# Patient Record
Sex: Female | Born: 1990 | Race: Black or African American | Hispanic: No | Marital: Single | Smoking: Current every day smoker
Health system: Southern US, Community
[De-identification: ages and names within clinical notes are randomized; demographics above are authoritative.]

## PROBLEM LIST (undated history)

## (undated) DIAGNOSIS — R569 Unspecified convulsions: Secondary | ICD-10-CM

## (undated) DIAGNOSIS — S82851A Displaced trimalleolar fracture of right lower leg, initial encounter for closed fracture: Secondary | ICD-10-CM

## (undated) DIAGNOSIS — Z8619 Personal history of other infectious and parasitic diseases: Secondary | ICD-10-CM

## (undated) DIAGNOSIS — Z87898 Personal history of other specified conditions: Secondary | ICD-10-CM

## (undated) HISTORY — DX: Personal history of other infectious and parasitic diseases: Z86.19

## (undated) HISTORY — PX: WISDOM TOOTH EXTRACTION: SHX21

---
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
## 2010-06-03 ENCOUNTER — Emergency Department (HOSPITAL_COMMUNITY): Admission: EM | Admit: 2010-06-03 | Discharge: 2010-06-03 | Payer: Self-pay | Admitting: Family Medicine

## 2010-06-05 ENCOUNTER — Emergency Department (HOSPITAL_COMMUNITY): Admission: EM | Admit: 2010-06-05 | Discharge: 2010-06-05 | Payer: Self-pay | Admitting: Family Medicine

## 2010-12-12 LAB — POCT RAPID STREP A (OFFICE): Streptococcus, Group A Screen (Direct): NEGATIVE

## 2010-12-12 LAB — STREP A DNA PROBE: Group A Strep Probe: NEGATIVE

## 2011-02-14 NOTE — Procedures (Signed)
CLINICAL HISTORY:  The patient is a 20 year old with previous EEG with three  episodes described as feeling nauseated followed by loss of consciousness  and shaking. This study was carried out with the patient sleep deprived. She  was awake and asleep during the recording. She takes no medication. The  International 10/20 system lead placement was used.   DESCRIPTION OF FINDINGS:  Dominant frequency is 11 hertz, 40 to 60 microvolt  activity, that is well regulated and attenuates partially with eye opening.  Background activity is a mixture of predominantly low voltage alpha/upper  theta range activity and beta range components.   Intermittent photo stimulation induced a driving response only around the  dominant frequency. The patient remained awake and then gradually drifted in  natural sleep and to drowsiness manifested by rhythmic theta activity  punctuated rare periods of arousal. Child drifted into natural sleep with  vertex sharp waves and symmetric and synchronous sleep spindles. The patient  was aroused toward the end and returned to waking record. There was no focal  slowing. There was no intraictal epileptiform activity in the form of spikes  or sharp waves. EKG showed regular sinus rhythm with ventricular response of  72 beats per minute.   IMPRESSION:  Normal record in the waking state and natural sleep after sleep  deprivation.    WILLIAM H. Sharene Skeans, M.D.   [id number]  D:  01/30/2004 06:59:45  T:  01/30/2004 08:07:48  Job #:  [id number]

## 2011-02-14 NOTE — Discharge Summary (Signed)
Sydney [name], Sydney [name]           ACCOUNT NO.:  [id number]   MEDICAL RECORD NO.:  [id number]          PATIENT TYPE:  INP   LOCATION:  0104                          FACILITY:  [facility]   PHYSICIAN:  Lalla Brothers, MDDATE OF BIRTH:  1990-11-29   DATE OF ADMISSION:  11/19/2004  DATE OF DISCHARGE:  11/26/2004                                 DISCHARGE SUMMARY   IDENTIFICATION:  This [age]-year-old female, eighth grade student at  [employer], was admitted emergently voluntarily on referral from  Peter Garter at [facility], who has seen the patient  since August of 2005 in therapy. She documented command auditory  hallucinations to harm or kill herself in the patient's emergency assessment  the day of admission and the need for hospitalization. For full details,  please see the typed admission assessment.   SYNOPSIS OF PRESENT ILLNESS:  The patient formulates that she started out  working on OCD symptoms in therapy and then switched to panic attacks. Along  the way, she was evaluated at [facility] for pseudoseizures and now she has  psychosis. The patient does not seem to solve the mechanism for her symptoms  but seems to displace her attention and conflict to another mechanism of  ineffectual dissipation. The patient reviews with little insight her video  EEG mechanisms at [facility] determining that she was having anxiety  rather than epilepsy. She did have a head scan and neurological assessment.  She is admitted reporting self-injury by making her fingertips bleed with  her fingernails, pens and pencils. She reported command auditory  hallucinations to jump from a bridge or from a moving car. She reported  visual hallucinations of seeing balls of light over the heads of people and  a person dressed in black standing in a room that others could not see. She  reported thermal misperceptions and apparently kinesthetic misperceptions in  a premonitory  fashion. She indicates she is very busy playing the flute and  piano in band and trying hard in academics. She is registering to be in a  [employer] [address] but recently quit dance because she was  overloaded. She states that sports keep her 85 year old brother busy. She  states father no longer lives with mother because he slacked off in his job.  Mother seems reasonably unavailable emotionally, usually smiling but having  little other interaction. The patient herself wants to be a child advocate  in the future.   INITIAL MENTAL STATUS EXAM:  The patient was neurotically organized and  conflicted but would not open up and talk particularly about family  structure confusion and diffusion. Father is most available to talk over  these concerns but is dismissed from the household while mother is present.  The patient had somatoform and conversion features in fact complaining of  sore throat the morning after admission with no medical findings evident.  She had borderline and hysteroid features to her own identity diffusion and  confusion. She was not disorganized and did not manifest pervasive  dissociation. However, the differential diagnosis must include unspecified  psychotic disorder as well as dissociative disorder,  unspecified type. She  reported suicidal ideation with irresistible command auditory hallucinations  to harm herself.   LABORATORY FINDINGS:  Admission CBC was normal except MCHC elevated at 34.3  with upper limit of normal 34, of doubtful significance. Monocyte  differential was 11% with upper limit of normal 9. White count was normal at  10,700, hemoglobin 14.1, MCV of 86 and platelet count 335,000. Comprehensive  metabolic panel was normal except BUN low at 5 with lower limit of normal 6  and indirect bilirubin elevated at 1.5 with upper limit of normal 1.  Alkaline phosphatase was borderline elevated at 118 for adult norms of 39-  117 so that she fit more  in adolescent norms. Sodium was normal at 139,  potassium 3.8, fasting glucose 89, creatinine 0.7, calcium 9.2, albumin 3.6,  AST 18 and ALT 13. GGT was normal at 19. Free T4 was normal at 1.01 and TSH  at 2.785. Urine hCG was negative. Urine drug screen was negative with  creatinine of 161 mg/dl. Urinalysis was normal with specific gravity of  1.018. RPR was nonreactive. Urine probe for gonorrhea and chlamydia  trichomatous by DNA amplification were both negative.   HOSPITAL COURSE AND TREATMENT:  General medical exam, by Vic Ripper,  P.A.-C., noted no medication allergies. The patient reported she had had  voices intermittently over the last month preceding admission. The patient  formulated that school, friends at home are good. She is histrionic and  superficial in this regard. She had menarche at age 61 and menses are  regular and she denies sexual activity. She had a benign birthmark on the  proximal medial left thigh. She reported that anxiety attacks have not only  neurologic but also respiratory symptoms at times. She was Tanner stage V  and denied any previous GYN exam. Admission height was 63-1/2 inches and  weight 148 pounds and discharge weight was 148 pounds. Blood pressure was  124/73 with heart rate of 94 (sitting) on admission and 112/74 with heart  rate of 106 (standing). Vital signs were normal throughout hospital stay  and, at the time of discharge, supine blood pressure was 117/58 with heart  rate of 89 and standing blood pressure 114/72 with heart rate of 98. The  patient participated in active inpatient treatment also in a superficial and  denial fashion initially. She and mother seemed to maintain the patient's  maladaptive style and pattern of interpersonal relatedness and problem-  solving that contribute to neurotic symptom formation. After initial family  intervention and two days of participation in all aspects of inpatient therapies, the patient was  started on Neurontin despite herself maintaining  that she was ready for discharge and needed no medication. She did comply  with Neurontin titrated up to 200 mg every morning and bedtime. The  medication was tolerated well and she had no side effects. She did have  dysmenorrhea during her hospital stay, treated with ibuprofen. The patient  gradually became more constructive in the treatment program, though she  still avoided family issues. She could address other issues. She was not  alienating to peers nor was she ostracized because of psychotic features.  Over the course of her hospitalization, it was clear that she had more  dissociative than psychotic features. The final family therapy session  included mother and maternal grandmother with father only participating  individually by phone. Mother and maternal grandmother projected blame to  paternal grandmother for all the patient's problems. They feel that paternal  grandmother tries to force the patient to be the perfect child as though  paternal grandmother is taking over as the mother figure. Mother did not  speak much in the session and maternal grandmother did most of the talking  but mother agreed with the maternal grandmother. In the final family therapy  session, the patient suggested that no one and nothing helps her much and  projected the decision for hospitalization to her therapist. The patient  acknowledged that paternal grandmother gets in all of her business but that  she does not let it bother her. Mother answered for the patient that father  should spend more time with the patient while the patient indicated that she  sees father enough and that he is trying to do better in his business and  should be acknowledged. The patient concluded that her misperceptions were  zoning out in mechanism. She indicated that her concentration was improved  and anxiety contained and she was ready for discharge. She required no   seclusion, restraint or equivalent of such during the hospital stay and did  participate in all aspects of active treatment program including group,  milieu, behavioral, individual, special education, anger management,  occupational and therapeutic recreational therapies.   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  2.  Anxiety disorder not otherwise specified.  3.  Conversion disorder.  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  5.  Rule out psychotic disorder not otherwise specified (provisional      diagnosis).  6.  Parent-child problem.  7.  Other specified family circumstances.   AXIS II:  Diagnosis deferred.   AXIS III:  1.  Myopia.  2.  Pseudoseizures.  3.  Mildly overweight.  4.  Elevated indirect bilirubin of doubtful significance.   AXIS IV:  Stressors:  Family--severe, acute and chronic; phase of life--  severe, acute and chronic; peer relations--moderate, acute and chronic.  AXIS V:  Global Assessment of Functioning on admission 41; highest in last  year 75; discharge Global Assessment of Functioning 56.   CONDITION ON DISCHARGE:  Though patient is very capable of participating in  therapy, she remains relatively insightless and relatively resistant  particularly relative to object relations issues with family and the various  condensations by which the family avoids resolving these problems. The  patient seems to be most perplexed about father being evicted from the  family because of his slacking off business. The patient seems hesitant to  achieve herself in that way but does continue including becoming aware of  her formal registration in the [employer] pageant during the  course of her hospitalization.   DISCHARGE MEDICATIONS:  She is discharged on Neurontin 100 mg capsule, to  take 2 capsules every morning and bedtime; quantity #120 with no refill. She  and mother were educated on  medication as was father.   The patient understands and family as well that she does not have epilepsy.  The patient does not manifest bipolar disorder.   ACTIVITY/DIET:  The patient follows a weight control diet and is encouraged  to be physically active.   FOLLOW UP:  Crisis and safety plans are outlined if needed. She will see the  Peter Garter on November 27, 2004 at 12 noon for therapy. She will see Christain Sacramento for medication management at [facility] on November 28, 2004 at 08:30.      GEJ/MEDQ  D:  11/27/2004  T:  11/27/2004  Job:  [id number]   cc:   Peter Garter  [facility]  [address]  [place], [place] [address]   Christain Sacramento  [facility]  [address]  [place], [place]

## 2011-02-14 NOTE — H&P (Signed)
NAMEJESSYE, [name]           ACCOUNT NO.:  [id number]   MEDICAL RECORD NO.:  [id number]          PATIENT TYPE:  INP   LOCATION:  0104                          FACILITY:  [facility]   PHYSICIAN:  Sydney [name], MDDATE OF BIRTH:  1990-12-23   DATE OF ADMISSION:  11/19/2004  DATE OF DISCHARGE:                         PSYCHIATRIC ADMISSION ASSESSMENT   IDENTIFICATION:  A 20-year-old female, 8th grade student at [employer], is admitted emergently, voluntarily on referral from Sydney [name] at  [employer], for inpatient stabilization of command  auditory hallucinations to harm or kill herself.   HISTORY OF PRESENT ILLNESS:  The patient has been in therapy with Sydney [name] since August of 2005.  She states initially she was thought to have  obsessive-compulsive disorder and then panic symptoms.  She is now  presenting with psychosis symptoms.  The patient indicates that no one has  been able to solve her problems, though at times they can clarify the  problems are not going to harm anything and may just be due to stress.  However the patient's distress is becoming more consequential over time.  She describes that she has had seizures spells that have been concluded to  be due to stress or anxiety.  She will not open up about the details for  these but notes she was ultimately evaluated at [facility] with video  EEG and did have a CT or MR scan.  She states all of her tests were normal  and the spells were determined to be stress or anxiety.  However the patient  has not capitalized upon such findings to resolve problems.  In fact she  seemed to have likely one of the pseudo seizures immediately being sent for  hospitalization.  The teacher was concerned that the patient had torn up  paper at school when looking somewhat dazed and distant, as though she was  somewhat disorganized.  The patient stated at the time of admission that she  does not  want to harm herself but she has been stabbing pens and pencils  into her finger tips as well as biting them until they bleed.  The patient  states she does these things to her fingernails and she is not sure how the  skin splits open.  The patient acknowledges that the voice made her tear up  papers in school the day of admission and the voices then telling her to  jump from a bridge.  The voice also more recently told her to jump from a  moving car.  She suggests that she does not want to do these things to harm  herself but cannot control herself when the voice becomes repeated and  constant.  She has also reported thermal sensations of misperceptions as  though something is hot or cold  near her.  She reports visual  hallucinations of balls of light over peoples' heads and a person dressed in  black standing in a room.  She reports that she has been considered to have  OCD and then panic.  She has also been concluded to have pseudo seizures.  The patient is using no alcohol or illicit drugs.  She states she is driven  in school and likely takes on too much.  She has been wanting to be in a  pageant and has recently quit dance.  She wants to be a child advocate.  She  plays the piano and flute in the band and is trying hard in her academics.   PAST MEDICAL HISTORY:  The patient has no known organic central nervous  system trauma.  The patient has myopia, treated with eyeglasses.  She had  chicken pox in the past.  She has a benign birthmark on the mid back.  Her  last menses was October 24, 2004 and she denies sexual activity.  She had  the neurological workup at [facility] for pseudo seizures, including a head  scan and a video EEG>  She is complaining of sore throat at the hospital.  She has no medication allergies.  She is on no current medications.  She has  had no heart murmur or arrythmia.  She has no other syncope.   REVIEW OF SYSTEMS:  The patient denies any difficulty with gait,  gaze or  continence.  She denies exposure to communicable disease or toxins.  She  denies rash, jaundice or purpura.  There is no chest pain, palpitations, or  presyncope.  There is no abdominal pain, nausea, vomiting or diarrhea.  There is no dysuria or arthralgia.  Immunizations are up to date.   FAMILY HISTORY:  The patient lives with mother and 48 year old brother.  She  states that her brother is into sports and that keeps him busy.  She tries  to stay busy but is not feeling the same sense of satisfaction or  fulfillment.  She states that parents never married.  She states that father  slacked off in his job and mother than separated.  The patient lives with  mother but father was here to visit the morning after admission.  They do  not clarify other major psychiatric disorders in the family history.   SOCIAL AND DEVELOPMENTAL HISTORY:  The patient is in the 8th grade at  [employer].  She wants to be a child advocate in the future.  She  plays the piano and flute in the band.  She has recently quit dance because  she was overloaded, but she still has more to take on including wanting to  do a pageant now.   ASSETS:  The patient is intelligent and driven.   MENTAL STATUS EXAM:  Height is 63.5 inches and weight is 148 pound with  blood pressure 124/73 with heart rate of 94 sitting and 112/74 with heart  rate of 106 standing.  The patient is right hand.  She is alert and oriented  with speech intact.  Cranial nerves II-XII intact.  Deep tendon reflexes and  AMRs are 0/0.  Muscle strength and tone are normal.  There are no  pathological reflexes or soft neurologic findings.  There are no abnormal  involuntary movements.  Gait and gaze are intact.  The patient is  neurotically organized and conflicted but she does not open up and talk  directly about such.  She has somatoform symptoms particularly in conversion pseudo seizures.  She has a sore throat this morning in same  regard.  She  has identity diffusion and confusion with borderline and hysteroid features.  She has no frank disorganization or paranoia.  She reports auditory, visual,  thermal, and kinesthetic  hallucinations.  She does not manifest frank  dissociation.  She reports command auditory hallucinations to harm or kill  herself.  She has suicide ideation and reports irrestible command auditory  hallucinations to harm herself but not others.   IMPRESSION:  AXIS 1:  1.  Psychotic disorder not otherwise specified.  2.  Anxiety disorder not otherwise specified.  3.  Conversion disorder.  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  5.  Parent-child problem.  6.  Other specified family circumstances.  7.  Other interpersonal problem.  AXIS II:  Diagnosis deferred.  AXIS III:  1.  Myopia.  2.  Pseudo seizures.  3.  Sore throat with benign exam.  AXIS IV:  Stressors:  Family - moderate to severe, acute and chronic; phase of life -  severe, acute and chronic; peer relations - moderate, acute and chronic.  AXIS V:  Global assessment of function at time of admission 41 with highest in last  year 75.   PLAN:  The patient is admitted for inpatient adolescent psychiatric and  multi-disciplinary, multi-modal behavioral health treatment in a team-based  program in a locked psychiatric unit.  We will consider Seroquel  pharmacotherapy.  Cognitive behavioral,  desensitization, rehabilitative  restoration of function, habit reversal, object relations, identity  consolidation, individuation and separation, anger management and  communication skill therapies are planned.  Estimated length of stay is 5-7  days with target symptoms for discharge being stabilization of suicide risk  and mood,  stabilization of misperceptions and mounting consequences of somatoform  decompensation, reestablishment of effective communication and relational  containment, and generalization of the capacity  for safe and effective  participation in outpatient treatment.      GEJ/MEDQ  D:  11/20/2004  T:  11/20/2004  Job:  [id number]

## 2011-02-14 NOTE — Procedures (Signed)
CLINICAL HISTORY:  The patient is a 20 year old who had two episodes of  seizure-like activity shaking within 1 week apart.  This was not further  described by the technologist.   PROCEDURE:  The tracing was carried out on a 32-channel digital Cadwell  recorder reformatted into 16-channel montages with one devoted to EKG.  The  patient was awake and drowsy during the recording.  The International 10-20  System lead placement was used.  Patient takes no medication.   DESCRIPTION OF FINDINGS:  Dominant frequency is a 25-50 microvolt 10 Hz  activity that is well regulated and attenuates partially with eye opening.   When the patient is fully awake, a rare theta range activity is superimposed  upon this and frontally predominant under 20 microvolt beta range activity  superimposed in the frontal regions.   When the patient becomes drowsy, mixed frequency rhythmic and semi-  arrhythmic 4-5 Hz 30-40 microvolt activity was seen in the central regions.  This then becomes generalized as the patient enters drowsiness.  The patient  then enters light natural sleep with vertex sharp waves, symmetric and  synchronous sleep spindles, 2-3 Hz desynchronized delta and lower theta  range activity.  There was no focal slowing.  There was a single generalized  irregularly contoured delta range discharge with embedded spikes lasting  less than 1 second.  This was not sufficient to be epileptogenic from an  electrographic viewpoint.   Activating procedures with hyperventilation caused no significant change,  intermittent photic stimulation failed to induce a driving response.   EKG showed regular sinus rhythm with ventricular response of 60 beats per  minute.   IMPRESSION:  Essentially normal record in the waking state.  Clinically  important a repeat study should be carried with the patient sleep deprived.    WILLIAM H. Sharene Skeans, M.D.   ZOX:WRUE  D:  01/24/2004 07:14:51  T:  01/24/2004 08:06:07   Job #:  [id number]   cc:   Alma Downs, M.D.  [address]  [place]  [place] [address]  Fax: [phone]

## 2012-10-03 ENCOUNTER — Encounter (HOSPITAL_COMMUNITY): Payer: Self-pay | Admitting: Emergency Medicine

## 2012-10-03 ENCOUNTER — Emergency Department (INDEPENDENT_AMBULATORY_CARE_PROVIDER_SITE_OTHER)
Admission: EM | Admit: 2012-10-03 | Discharge: 2012-10-03 | Disposition: A | Discharge status: Nursing Home (Includes ICF) | Payer: BC Managed Care – PPO | Source: Home / Self Care

## 2012-10-03 ENCOUNTER — Emergency Department (HOSPITAL_COMMUNITY)
Admission: EM | Admit: 2012-10-03 | Discharge: 2012-10-03 | Disposition: A | Discharge status: Home / Self Care | Payer: BC Managed Care – PPO | Attending: Emergency Medicine | Admitting: Emergency Medicine

## 2012-10-03 ENCOUNTER — Emergency Department (HOSPITAL_COMMUNITY): Payer: BC Managed Care – PPO

## 2012-10-03 ENCOUNTER — Encounter (HOSPITAL_COMMUNITY): Payer: Self-pay | Admitting: *Deleted

## 2012-10-03 DIAGNOSIS — H5789 Other specified disorders of eye and adnexa: Secondary | ICD-10-CM | POA: Insufficient documentation

## 2012-10-03 DIAGNOSIS — S0010XA Contusion of unspecified eyelid and periocular area, initial encounter: Secondary | ICD-10-CM | POA: Insufficient documentation

## 2012-10-03 DIAGNOSIS — H05239 Hemorrhage of unspecified orbit: Secondary | ICD-10-CM

## 2012-10-03 MED ORDER — HYDROCODONE-ACETAMINOPHEN 5-325 MG PO TABS: 1.0000 | ORAL_TABLET | Freq: Four times a day (QID) | ORAL | Status: DC | PRN

## 2012-10-03 MED ORDER — IBUPROFEN 800 MG PO TABS
800.0000 mg | ORAL_TABLET | Freq: Once | ORAL | Status: AC
  Administered 2012-10-03: 800 mg via ORAL
  Filled 2012-10-03: qty 1

## 2012-10-03 MED ORDER — IBUPROFEN 800 MG PO TABS: 800.0000 mg | ORAL_TABLET | Freq: Three times a day (TID) | ORAL | Status: DC | PRN

## 2012-10-03 NOTE — ED Notes (Signed)
Pt given fresh ice pack

## 2012-10-03 NOTE — ED Provider Notes (Signed)
Medical screening examination/treatment/procedure(s) were performed by non-physician practitioner and as supervising physician I was immediately available for consultation/collaboration.  Raynald Blend, MD 10/03/12 1435
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
## 2012-10-03 NOTE — ED Provider Notes (Signed)
Medical screening examination/treatment/procedure(s) were performed by non-physician practitioner and as supervising physician I was immediately available for consultation/collaboration.   Dione Booze, MD 10/03/12 [phone]
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
## 2012-10-03 NOTE — ED Notes (Signed)
Report given to S. York, RN and M. Ward, CMA.
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
## 2012-10-03 NOTE — ED Notes (Signed)
[employer] notified after pt requested.
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
## 2014-11-15 LAB — PROCEDURE REPORT - SCANNED: PAP SMEAR: NEGATIVE

## 2015-01-25 ENCOUNTER — Emergency Department (HOSPITAL_COMMUNITY)
Admission: EM | Admit: 2015-01-25 | Discharge: 2015-01-25 | Disposition: A | Discharge status: Home / Self Care | Payer: 59 | Attending: Emergency Medicine | Admitting: Emergency Medicine

## 2015-01-25 ENCOUNTER — Encounter (HOSPITAL_COMMUNITY): Payer: Self-pay | Admitting: Emergency Medicine

## 2015-01-25 DIAGNOSIS — Z720 Tobacco use: Secondary | ICD-10-CM | POA: Insufficient documentation

## 2015-01-25 DIAGNOSIS — L0501 Pilonidal cyst with abscess: Secondary | ICD-10-CM | POA: Insufficient documentation

## 2015-01-25 DIAGNOSIS — K6289 Other specified diseases of anus and rectum: Secondary | ICD-10-CM | POA: Diagnosis present

## 2015-01-25 MED ORDER — LIDOCAINE-EPINEPHRINE (PF) 2 %-1:200000 IJ SOLN
10.0000 mL | Freq: Once | INTRAMUSCULAR | Status: DC
  Filled 2015-01-25: qty 10

## 2015-01-25 MED ORDER — HYDROCODONE-ACETAMINOPHEN 5-325 MG PO TABS: 1.0000 | ORAL_TABLET | ORAL | Status: DC | PRN

## 2015-01-25 MED ORDER — OXYCODONE-ACETAMINOPHEN 5-325 MG PO TABS
1.0000 | ORAL_TABLET | Freq: Once | ORAL | Status: AC
  Administered 2015-01-25: 1 via ORAL
  Filled 2015-01-25: qty 1

## 2015-01-25 MED ORDER — LIDOCAINE-EPINEPHRINE 2 %-1:100000 IJ SOLN
INTRAMUSCULAR | Status: AC
  Administered 2015-01-25: 1 mL
  Filled 2015-01-25: qty 1

## 2015-01-25 NOTE — Discharge Instructions (Signed)
°  Pilonidal Cyst, Care After °A pilonidal cyst occurs when hairs get trapped (ingrown) beneath the skin in the crease between the buttocks over your sacrum (the bone under that crease). Pilonidal cysts are most common in young men with a lot of body hair. When the cyst breaks(ruptured) or leaks, fluid from the cyst may cause burning and itching. If the cyst becomes infected, it causes a painful swelling filled with pus (abscess). The pus and trapped hairs need to be removed (often by lancing) so that the infection can heal. The word pilonidal means hair nest. °HOME CARE INSTRUCTIONS °If the pilonidal sinus was NOT DRAINING OR LANCED: °· Keep the area clean and dry. Bathe or shower daily. Wash the area well with a germ-killing soap. Hot tub baths may help prevent infection. Dry the area well with a towel. °· Avoid tight clothing in order to keep area as moisture-free as possible. °· Keep area between buttocks as free from hair as possible. A depilatory may be used. °· Take antibiotics as directed. °· Only take over-the-counter or prescription medicines for pain, discomfort, or fever as directed by your caregiver. °If the cyst WAS INFECTED AND NEEDED TO BE DRAINED: °· Your caregiver may have packed the wound with gauze to keep the wound open. This allows the wound to heal from the inside outward and continue to drain. °· Return as directed for a wound check. °· If you take tub baths or showers, repack the wound with gauze as directed following. Sponge baths are a good alternative. Sitz baths may be used three to four times a day or as directed. °· If an antibiotic was ordered to fight the infection, take as directed. °· Only take over-the-counter or prescription medicines for pain, discomfort, or fever as directed by your caregiver. °· If a drain was in place and removed, use sitz baths for 20 minutes 4 times per day. Clean the wound gently with mild unscented soap, pat dry, and then apply a dry dressing as  directed. °If you had surgery and IT WAS MARSUPIALIZED (LEFT OPEN): °· Your wound was packed with gauze to keep the wound open. This allows the wound to heal from the inside outwards and continue draining. The changing of the dressing regularly also helps keep the wound clean. °· Return as directed for a wound check. °· If you take tub baths or showers, repack the wound with gauze as directed following. Sponge baths are a good alternative. Sitz baths can also be used. This may be done three to four times a day or as directed. °· If an antibiotic was ordered to fight the infection, take as directed. °· Only take over-the-counter or prescription medicines for pain, discomfort, or fever as directed by your caregiver. °· If you had surgery and the wound was closed you may care for it as directed. This generally includes keeping it dry and clean and dressing it as directed. °SEEK MEDICAL CARE IF:  °· You have increased pain, swelling, redness, drainage, or bleeding from the area. °· You have a fever. °· You have muscles aches, dizziness, or a general ill feeling. °Document Released: 10/16/2006 Document Revised: 05/18/2013 Document Reviewed: 12/31/2006 °ExitCare® Patient Information ©2015 [employer]. This information is not intended to replace advice given to you by your health care provider. Make sure you discuss any questions you have with your health care provider. ° ° °

## 2015-01-25 NOTE — ED Notes (Signed)
Pt is c/o pain at her tailbone area  Pt states the pain started Tuesday and has progressively gotten worse  Pt states this happened once before but in a couple days it went away  Pt states she took ibuprofen earlier and the pain decreased

## 2015-01-25 NOTE — ED Provider Notes (Signed)
CSN: [id number]     Arrival date & time 01/25/15  0002 History   First MD Initiated Contact with Patient 01/25/15 0031     Chief Complaint  Patient presents with  . Cyst     (Consider location/radiation/quality/duration/timing/severity/associated sxs/prior Treatment) Patient is a 24 y.o. female presenting with abscess. The history is provided by the patient. No language interpreter was used.  Abscess Location:  Ano-genital Ano-genital abscess location:  Gluteal cleft Abscess quality: not draining   Red streaking: no   Duration:  2 days Progression:  Worsening Associated symptoms: no fever and no nausea   Associated symptoms comment:  Painful swollen area at gluteal cleft without drainage, fever, or redness. She has had an abscess in this area once before that she reports resolved spontaneously.    History reviewed. No pertinent past medical history. History reviewed. No pertinent past surgical history. Family History  Problem Relation Age of Onset  . Hypertension Other   . Hyperlipidemia Other    History  Substance Use Topics  . Smoking status: Current Every Day Smoker    Types: Cigarettes  . Smokeless tobacco: Not on file  . Alcohol Use: Yes   OB History    No data available     Review of Systems  Constitutional: Negative for fever.  Gastrointestinal: Negative.  Negative for nausea.  Musculoskeletal: Negative.  Negative for myalgias.  Skin:       See HPI.      Allergies  Review of patient's allergies indicates no known allergies.  Home Medications   Prior to Admission medications   Medication Sig Start Date End Date Taking? Authorizing Provider  HYDROcodone-acetaminophen (NORCO/VICODIN) 5-325 MG per tablet Take 1 tablet by mouth every 6 (six) hours as needed for pain. 10/03/12   Charlestine Night, PA-C  ibuprofen (ADVIL,MOTRIN) 800 MG tablet Take 1 tablet (800 mg total) by mouth every 8 (eight) hours as needed for pain. 10/03/12   Christopher Lawyer, PA-C    BP 148/92 mmHg  Pulse 102  Temp(Src) 98 F (36.7 C) (Oral)  Resp 16  SpO2 98%  LMP 01/12/2015 (Exact Date) Physical Exam  Constitutional: She is oriented to person, place, and time. She appears well-developed and well-nourished.  Neck: Normal range of motion.  Pulmonary/Chest: Effort normal.  Neurological: She is alert and oriented to person, place, and time.  Skin: Skin is warm and dry.  Indurated area bilateral gluteal cleft with area of fluctuance on right buttock. No drainage. Minimal redness.     ED Course  Procedures (including critical care time) Labs Review Labs Reviewed - No data to display  Imaging Review No results found.   EKG Interpretation None     INCISION AND DRAINAGE Performed by: Elpidio Anis A Consent: Verbal consent obtained. Risks and benefits: risks, benefits and alternatives were discussed Type: abscess  Body area: gluteal cleft  Anesthesia: local infiltration  Incision was made with a scalpel.  Local anesthetic: lidocaine 2% w/ epinephrine  Anesthetic total: 2 ml  Complexity: complex Blunt dissection to break up loculations  Drainage: purulent  Drainage amount: large, purulent, malodorous  Packing material: 1/4 in iodoform gauze  Patient tolerance: Patient tolerated the procedure well with no immediate complications.    MDM   Final diagnoses:  None    1. Pilonidal abscess   Uncomplicated pilonidal cyst with abscess adequately drained and packed with iodoform gauze. She will return in 2 days for recheck and packing removal.    Elpidio Anis, PA-C 01/25/15 0210  Loren Raceravid Yelverton, MD 01/25/15 [phone]

## 2015-01-27 ENCOUNTER — Emergency Department (HOSPITAL_COMMUNITY)
Admission: EM | Admit: 2015-01-27 | Discharge: 2015-01-27 | Disposition: A | Discharge status: Home / Self Care | Payer: 59 | Attending: Emergency Medicine | Admitting: Emergency Medicine

## 2015-01-27 ENCOUNTER — Encounter (HOSPITAL_COMMUNITY): Payer: Self-pay | Admitting: Emergency Medicine

## 2015-01-27 DIAGNOSIS — H9202 Otalgia, left ear: Secondary | ICD-10-CM | POA: Insufficient documentation

## 2015-01-27 DIAGNOSIS — L0591 Pilonidal cyst without abscess: Secondary | ICD-10-CM | POA: Diagnosis not present

## 2015-01-27 DIAGNOSIS — J029 Acute pharyngitis, unspecified: Secondary | ICD-10-CM | POA: Insufficient documentation

## 2015-01-27 DIAGNOSIS — Z720 Tobacco use: Secondary | ICD-10-CM | POA: Diagnosis not present

## 2015-01-27 DIAGNOSIS — Z4801 Encounter for change or removal of surgical wound dressing: Secondary | ICD-10-CM | POA: Diagnosis present

## 2015-01-27 LAB — RAPID STREP SCREEN (MED CTR MEBANE ONLY): Streptococcus, Group A Screen (Direct): NEGATIVE

## 2015-01-27 MED ORDER — CLINDAMYCIN HCL 300 MG PO CAPS
300.0000 mg | ORAL_CAPSULE | Freq: Once | ORAL | Status: AC
  Administered 2015-01-27: 300 mg via ORAL
  Filled 2015-01-27: qty 1

## 2015-01-27 MED ORDER — MAGIC MOUTHWASH
10.0000 mL | Freq: Once | ORAL | Status: AC
  Administered 2015-01-27: 10 mL via ORAL
  Filled 2015-01-27: qty 10

## 2015-01-27 MED ORDER — HYDROCODONE-ACETAMINOPHEN 5-325 MG PO TABS: 1.0000 | ORAL_TABLET | ORAL | Status: DC | PRN

## 2015-01-27 MED ORDER — PREDNISONE 20 MG PO TABS: ORAL_TABLET | ORAL | Status: DC

## 2015-01-27 MED ORDER — CLINDAMYCIN HCL 150 MG PO CAPS: 150.0000 mg | ORAL_CAPSULE | Freq: Four times a day (QID) | ORAL | Status: DC

## 2015-01-27 MED ORDER — DEXAMETHASONE SODIUM PHOSPHATE 10 MG/ML IJ SOLN
10.0000 mg | Freq: Once | INTRAMUSCULAR | Status: AC
  Administered 2015-01-27: 10 mg via INTRAMUSCULAR
  Filled 2015-01-27: qty 1

## 2015-01-27 NOTE — Discharge Instructions (Signed)
Please stay hydrated and take antibiotic as prescribed for your sore throat.  Follow up with ENT specialist early next week for recheck of your throat infection.  Continue to run warm water to abscess of your buttock.  Return to ER if you are having worsening throat swelling, trouble swallowing or difficulty breathing.  Your strep test is normal today.    Pharyngitis Pharyngitis is redness, pain, and swelling (inflammation) of your pharynx.  CAUSES  Pharyngitis is usually caused by infection. Most of the time, these infections are from viruses (viral) and are part of a cold. However, sometimes pharyngitis is caused by bacteria (bacterial). Pharyngitis can also be caused by allergies. Viral pharyngitis may be spread from person to person by coughing, sneezing, and personal items or utensils (cups, forks, spoons, toothbrushes). Bacterial pharyngitis may be spread from person to person by more intimate contact, such as kissing.  SIGNS AND SYMPTOMS  Symptoms of pharyngitis include:   Sore throat.   Tiredness (fatigue).   Low-grade fever.   Headache.  Joint pain and muscle aches.  Skin rashes.  Swollen lymph nodes.  Plaque-like film on throat or tonsils (often seen with bacterial pharyngitis). DIAGNOSIS  Your health care provider will ask you questions about your illness and your symptoms. Your medical history, along with a physical exam, is often all that is needed to diagnose pharyngitis. Sometimes, a rapid strep test is done. Other lab tests may also be done, depending on the suspected cause.  TREATMENT  Viral pharyngitis will usually get better in 3-4 days without the use of medicine. Bacterial pharyngitis is treated with medicines that kill germs (antibiotics).  HOME CARE INSTRUCTIONS   Drink enough water and fluids to keep your urine clear or pale yellow.   Only take over-the-counter or prescription medicines as directed by your health care provider:   If you are prescribed  antibiotics, make sure you finish them even if you start to feel better.   Do not take aspirin.   Get lots of rest.   Gargle with 8 oz of salt water ( tsp of salt per 1 qt of water) as often as every 1-2 hours to soothe your throat.   Throat lozenges (if you are not at risk for choking) or sprays may be used to soothe your throat. SEEK MEDICAL CARE IF:   You have large, tender lumps in your neck.  You have a rash.  You cough up green, yellow-brown, or bloody spit. SEEK IMMEDIATE MEDICAL CARE IF:   Your neck becomes stiff.  You drool or are unable to swallow liquids.  You vomit or are unable to keep medicines or liquids down.  You have severe pain that does not go away with the use of recommended medicines.  You have trouble breathing (not caused by a stuffy nose). MAKE SURE YOU:   Understand these instructions.  Will watch your condition.  Will get help right away if you are not doing well or get worse. Document Released: 09/15/2005 Document Revised: 07/06/2013 Document Reviewed: 05/23/2013 [facility] IncExitCare Patient Information 2015 [place], [place]. This information is not intended to replace advice given to you by your health care provider. Make sure you discuss any questions you have with your health care provider.

## 2015-01-27 NOTE — ED Notes (Signed)
Per pt, here for abscess follow-up

## 2015-01-27 NOTE — ED Provider Notes (Signed)
CSN: [id number]     Arrival date & time 01/27/15  1600 History  This chart was scribed for non-physician practitioner, Fayrene Helper, PA-C,working with Azalia Bilis, MD, by Karle Plumber, ED Scribe. This patient was seen in room WTR9/WTR9 and the patient's care was started at 4:18 PM.  Chief Complaint  Patient presents with  . abscess follow up    The history is provided by the patient and medical records. No language interpreter was used.    HPI Comments:  Sydney [name] is a 24 y.o. female who presents to the Emergency Department wanting a recheck of an abscess in the pilonidal area that was incised and drained two days ago. She reports she has been soaking in warm water with significant relief of the pain. Pt reports since having the abscess drained she has been experiencing left sided facial swelling, sore throat and mild left ear pain. She has not done anything to treat these symptoms. Denies modifying factors. She denies constipation, fever, chills, rhinorrhea, sneezing, nausea or vomiting.  History reviewed. No pertinent past medical history. History reviewed. No pertinent past surgical history. Family History  Problem Relation Age of Onset  . Hypertension Other   . Hyperlipidemia Other    History  Substance Use Topics  . Smoking status: Current Every Day Smoker    Types: Cigarettes  . Smokeless tobacco: Not on file  . Alcohol Use: Yes   OB History    No data available     Review of Systems  Constitutional: Negative for fever and chills.  HENT: Positive for ear pain and sore throat. Negative for rhinorrhea and sneezing.   Gastrointestinal: Negative for nausea, vomiting and constipation.  Skin: Positive for wound.    Allergies  Review of patient's allergies indicates no known allergies.  Home Medications   Prior to Admission medications   Medication Sig Start Date End Date Taking? Authorizing Provider  HYDROcodone-acetaminophen (NORCO/VICODIN) 5-325 MG per tablet  Take 1-2 tablets by mouth every 4 (four) hours as needed. 01/25/15   Elpidio Anis, PA-C  ibuprofen (ADVIL,MOTRIN) 800 MG tablet Take 1 tablet (800 mg total) by mouth every 8 (eight) hours as needed for pain. 10/03/12   Charlestine Night, PA-C   Triage Vitals: BP 138/89 mmHg  Pulse 119  Temp(Src) 98.6 F (37 C) (Oral)  Resp 12  SpO2 100%  LMP 01/12/2015 (Exact Date) Physical Exam  Constitutional: She is oriented to person, place, and time. She appears well-developed and well-nourished.  HENT:  Head: Normocephalic and atraumatic.  Mouth/Throat: Mucous membranes are normal. No trismus in the jaw. Posterior oropharyngeal erythema present.  Uvula mildly deviated to the right. Left tonsillar pillar enlarged, erythematous. Tonsils with grayish appearance bilaterally.  No trismus  Eyes: EOM are normal.  Neck: Normal range of motion.  No nuchal rigidity  Cardiovascular: Normal rate.   Pulmonary/Chest: Effort normal.  Abdominal: There is no tenderness.  Musculoskeletal: Normal range of motion.  Well-healing abscess to gluteal cleft. No surrounding erythema.  Packing removed.  Lymphadenopathy:    She has cervical adenopathy.  Neurological: She is alert and oriented to person, place, and time.  Skin: Skin is warm and dry.  Psychiatric: She has a normal mood and affect. Her behavior is normal.  Nursing note and vitals reviewed.   ED Course  Procedures (including critical care time) DIAGNOSTIC STUDIES: Oxygen Saturation is 100% on RA, normal by my interpretation.   COORDINATION OF CARE: 4:27 PM- Will send rapid strep test. Pt verbalizes understanding and  agrees to plan.  5:10 PM Pt here initially for wound check up after I&D of infected pilonidal cyst.  Abscess has drained appropriately.  Packing removed.  Care instruction provided.  She however also has sore throat and neck pain without URI sxs.  Finding concerning for PTA.  Rapid strep test obtained.  Care discussed with Dr. Patria Maneampos who  also evaluated pt and agrees.  Does not think CT is indicated at this time but recommend pt to f/u with ENT early next week for a close follow up.  Grayish tonsils, unusual appearance.    6:11 PM Strep neg.  Pt will bd discharge with appropiate treatment and f/u .  Return precaution discussed.  Medications - No data to display  Labs Review Labs Reviewed  RAPID STREP SCREEN  CULTURE, GROUP A STREP    Imaging Review No results found.   EKG Interpretation None      MDM   Final diagnoses:  Pharyngitis  Infected pilonidal cyst    BP 140/85 mmHg  Pulse 119  Temp(Src) 98.6 F (37 C) (Oral)  Resp 16  SpO2 99%  LMP 01/12/2015 (Exact Date) Tachycardia however pt able to tolerates PO.  Recommend staying well hydrated but to return promptly if she feels dehydrated or unable to tolerates PO.    I personally performed the services described in this documentation, which was scribed in my presence. The recorded information has been reviewed and is accurate.    Fayrene HelperBowie Levelle Edelen, PA-C 01/27/15 1847  Azalia BilisKevin Campos, MD 01/27/15 [phone]

## 2015-01-28 NOTE — Progress Notes (Signed)
Discharge instructions reviewed with patient by charge nurse.Patient discharged to home.

## 2015-01-30 LAB — CULTURE, GROUP A STREP

## 2015-02-25 ENCOUNTER — Encounter (HOSPITAL_COMMUNITY): Payer: Self-pay | Admitting: *Deleted

## 2015-02-25 ENCOUNTER — Emergency Department (HOSPITAL_COMMUNITY)
Admission: EM | Admit: 2015-02-25 | Discharge: 2015-02-25 | Disposition: A | Discharge status: Home / Self Care | Payer: 59 | Attending: Emergency Medicine | Admitting: Emergency Medicine

## 2015-02-25 DIAGNOSIS — Z792 Long term (current) use of antibiotics: Secondary | ICD-10-CM | POA: Insufficient documentation

## 2015-02-25 DIAGNOSIS — R569 Unspecified convulsions: Secondary | ICD-10-CM | POA: Diagnosis not present

## 2015-02-25 DIAGNOSIS — Z720 Tobacco use: Secondary | ICD-10-CM | POA: Diagnosis not present

## 2015-02-25 DIAGNOSIS — Z79899 Other long term (current) drug therapy: Secondary | ICD-10-CM | POA: Insufficient documentation

## 2015-02-25 DIAGNOSIS — Z3202 Encounter for pregnancy test, result negative: Secondary | ICD-10-CM | POA: Diagnosis not present

## 2015-02-25 LAB — CBC WITH DIFFERENTIAL/PLATELET
BASOS PCT: 0 % (ref 0–1)
Basophils Absolute: 0 10*3/uL (ref 0.0–0.1)
EOS PCT: 5 % (ref 0–5)
Eosinophils Absolute: 0.4 10*3/uL (ref 0.0–0.7)
HEMATOCRIT: 40.3 % (ref 36.0–46.0)
Hemoglobin: 13.6 g/dL (ref 12.0–15.0)
LYMPHS ABS: 2.2 10*3/uL (ref 0.7–4.0)
LYMPHS PCT: 27 % (ref 12–46)
MCH: 30.5 pg (ref 26.0–34.0)
MCHC: 33.7 g/dL (ref 30.0–36.0)
MCV: 90.4 fL (ref 78.0–100.0)
MONOS PCT: 11 % (ref 3–12)
Monocytes Absolute: 0.9 10*3/uL (ref 0.1–1.0)
Neutro Abs: 4.8 10*3/uL (ref 1.7–7.7)
Neutrophils Relative %: 57 % (ref 43–77)
Platelets: 250 10*3/uL (ref 150–400)
RBC: 4.46 MIL/uL (ref 3.87–5.11)
RDW: 13.2 % (ref 11.5–15.5)
WBC: 8.3 10*3/uL (ref 4.0–10.5)

## 2015-02-25 LAB — RAPID URINE DRUG SCREEN, HOSP PERFORMED
Amphetamines: NOT DETECTED
BARBITURATES: NOT DETECTED
Benzodiazepines: NOT DETECTED
COCAINE: NOT DETECTED
Opiates: NOT DETECTED
Tetrahydrocannabinol: NOT DETECTED

## 2015-02-25 LAB — COMPREHENSIVE METABOLIC PANEL
ALBUMIN: 3.8 g/dL (ref 3.5–5.0)
ALK PHOS: 64 U/L (ref 38–126)
ALT: 14 U/L (ref 14–54)
ANION GAP: 7 (ref 5–15)
AST: 18 U/L (ref 15–41)
BILIRUBIN TOTAL: 0.6 mg/dL (ref 0.3–1.2)
BUN: 5 mg/dL — ABNORMAL LOW (ref 6–20)
CALCIUM: 9 mg/dL (ref 8.9–10.3)
CHLORIDE: 108 mmol/L (ref 101–111)
CO2: 24 mmol/L (ref 22–32)
CREATININE: 0.63 mg/dL (ref 0.44–1.00)
GFR calc non Af Amer: >60 mL/min (ref >60)
Glucose, Bld: 96 mg/dL (ref 65–99)
POTASSIUM: 3.8 mmol/L (ref 3.5–5.1)
SODIUM: 139 mmol/L (ref 135–145)
Total Protein: 6.9 g/dL (ref 6.5–8.1)

## 2015-02-25 LAB — URINALYSIS, ROUTINE W REFLEX MICROSCOPIC
BILIRUBIN URINE: NEGATIVE
GLUCOSE, UA: NEGATIVE mg/dL
Hgb urine dipstick: NEGATIVE
Ketones, ur: NEGATIVE mg/dL
NITRITE: NEGATIVE
Protein, ur: NEGATIVE mg/dL
SPECIFIC GRAVITY, URINE: 1.005 (ref 1.005–1.030)
Urobilinogen, UA: 0.2 mg/dL (ref 0.0–1.0)
pH: 7 (ref 5.0–8.0)

## 2015-02-25 LAB — ETHANOL

## 2015-02-25 LAB — POC URINE PREG, ED: Preg Test, Ur: NEGATIVE

## 2015-02-25 LAB — URINE MICROSCOPIC-ADD ON
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
## 2015-02-25 NOTE — ED Provider Notes (Signed)
CSN: [id number]     Arrival date & time 02/25/15  1805 History   First MD Initiated Contact with Patient 02/25/15 1833     Chief Complaint  Patient presents with  . Seizures     (Consider location/radiation/quality/duration/timing/severity/associated sxs/prior Treatment) The history is provided by the patient and medical records. No language interpreter was used.     Sydney [name] is a 24 y.o. female  with a hx of seizure like activity presents to the Emergency Department complaining of acute seizure activity onset 5pm.  Pt's cousin witnessed the seizure.  She reports that the pt's eyes rolled back into her head and then she fell backwards.  Denies movement of arms/legs, but reports persistent horizontal eye movements.  This episode lasted approximately 5-7 minutes unti EMS arrived.  Pt's cousin reports that she was breathing regularly throughout the entire episode.  Denies cyanosis.  No known aggravating or alleviating factors.  Pt denies fever or chills, headache or neck pain, chest pain, shortness of breath, abdominal pain, nausea, vomiting, diarrhea, weakness, dizziness, stroke, hematuria.   Last seizure 3 years ago; she is currently no medications. Pt [facility] after the last seizure who "ruled out" epilepsy, but I am [facility] to [facility].  Pt was previously taking antidepressants has never been on anticonvulsant medications.  Pt reports lots of work and financial stress, large amounts of caffeine usage.  She reports sleeping well 6 hours per night.  Pt reports EtOH usage 1-2 times per week socially, smokes 1ppd of cigarettes, denies illicit drug usage.      History reviewed. No pertinent past medical history. History reviewed. No pertinent past surgical history. Family History  Problem Relation Age of Onset  . Hypertension Other   . Hyperlipidemia Other    History  Substance Use Topics  . Smoking  status: Current Every Day Smoker    Types: Cigarettes  . Smokeless tobacco: Not on file  . Alcohol Use: Yes   OB History    No data available     Review of Systems  Constitutional: Negative for fever, diaphoresis, appetite change, fatigue and unexpected weight change.  HENT: Negative for mouth sores.   Eyes: Negative for visual disturbance.  Respiratory: Negative for cough, chest tightness, shortness of breath and wheezing.   Cardiovascular: Negative for chest pain.  Gastrointestinal: Negative for nausea, vomiting, abdominal pain, diarrhea and constipation.  Endocrine: Negative for polydipsia, polyphagia and polyuria.  Genitourinary: Negative for dysuria, urgency, frequency and hematuria.  Musculoskeletal: Negative for back pain and neck stiffness.  Skin: Negative for rash.  Allergic/Immunologic: Negative for immunocompromised state.  Neurological: Positive for seizures. Negative for syncope, light-headedness and headaches.  Hematological: Does not bruise/bleed easily.  Psychiatric/Behavioral: Negative for sleep disturbance. The patient is not nervous/anxious.       Allergies  Review of patient's allergies indicates no known allergies.  Home Medications   Prior to Admission medications   Medication Sig Start Date End Date Taking? Authorizing Provider  ibuprofen (ADVIL,MOTRIN) 800 MG tablet Take 1 tablet (800 mg total) by mouth every 8 (eight) hours as needed for pain. 10/03/12  Yes Christopher Lawyer, PA-C  clindamycin (CLEOCIN) 150 MG capsule Take 1 capsule (150 mg total) by mouth every 6 (six) hours. Patient not taking: Reported on 02/25/2015 01/27/15   Fayrene HelperBowie Tran, PA-C  HYDROcodone-acetaminophen (NORCO/VICODIN) 5-325 MG per tablet Take 1-2 tablets by mouth every 4 (four) hours as needed. Patient not  taking: Reported on 02/25/2015 01/27/15   Fayrene Helper, PA-C  predniSONE (DELTASONE) 20 MG tablet 3 tabs po day one, then 2 tabs daily x 4 days Patient not taking: Reported on  02/25/2015 01/27/15   Fayrene Helper, PA-C   BP 133/66 mmHg  Pulse 77  Temp(Src) 98.4 F (36.9 C) (Oral)  Resp 20  Ht  (1.626 m)  Wt 185 lb (83.915 kg)  BMI 31.74 kg/m2  SpO2 99%  LMP 02/07/2015 Physical Exam  Constitutional: She is oriented to person, place, and time. She appears well-developed and well-nourished. No distress.  HENT:  Head: Normocephalic and atraumatic.  Mouth/Throat: Oropharynx is clear and moist.  No oral trauma  Eyes: Conjunctivae and EOM are normal. Pupils are equal, round, and reactive to light. No scleral icterus.  No horizontal, vertical or rotational nystagmus  Neck: Normal range of motion. Neck supple.  Full active and passive ROM without pain No midline or paraspinal tenderness No nuchal rigidity or meningeal signs  Cardiovascular: Normal rate, regular rhythm, normal heart sounds and intact distal pulses.   No murmur heard. Pulmonary/Chest: Effort normal and breath sounds normal. No respiratory distress. She has no wheezes. She has no rales.  Abdominal: Soft. Bowel sounds are normal. There is no tenderness. There is no rebound and no guarding.  Musculoskeletal: Normal range of motion.  Lymphadenopathy:    She has no cervical adenopathy.  Neurological: She is alert and oriented to person, place, and time. She has normal reflexes. No cranial nerve deficit. She exhibits normal muscle tone. Coordination normal.  Mental Status:  Alert, oriented, thought content appropriate. Speech fluent without evidence of aphasia. Able to follow 2 step commands without difficulty.  Cranial Nerves:  II:  Peripheral visual fields grossly normal, pupils equal, round, reactive to light III,IV, VI: ptosis not present, extra-ocular motions intact bilaterally  V,VII: smile symmetric, facial light touch sensation equal VIII: hearing grossly normal bilaterally  IX,X: gag reflex present  XI: bilateral shoulder shrug equal and strong XII: midline tongue extension  Motor:  5/5  in upper and lower extremities bilaterally including strong and equal grip strength and dorsiflexion/plantar flexion Sensory: Pinprick and light touch normal in all extremities.  Deep Tendon Reflexes: 2+ and symmetric  Cerebellar: normal finger-to-nose with bilateral upper extremities Gait: normal gait and balance CV: distal pulses palpable throughout   Skin: Skin is warm and dry. No rash noted. She is not diaphoretic.  Psychiatric: She has a normal mood and affect. Her behavior is normal. Judgment and thought content normal.  Nursing note and vitals reviewed.   ED Course  Procedures (including critical care time) Labs Review Labs Reviewed  COMPREHENSIVE METABOLIC PANEL - Abnormal; Notable for the following:    BUN 5 (*)    All other components within normal limits  URINALYSIS, ROUTINE W REFLEX MICROSCOPIC (NOT AT [facility]) - Abnormal; Notable for the following:    Leukocytes, UA TRACE (*)    All other components within normal limits  URINE MICROSCOPIC-ADD ON - Abnormal; Notable for the following:    Squamous Epithelial / LPF FEW (*)    All other components within normal limits  CBC WITH DIFFERENTIAL/PLATELET  ETHANOL  URINE RAPID DRUG SCREEN (HOSP PERFORMED) NOT AT [facility]  POC URINE PREG, ED    Imaging Review No results found.   EKG Interpretation None       <ECG>  ED ECG REPORT   Date: 02/26/2015  Rate: 100  Rhythm: sinus tachycardia  QRS Axis: normal  Intervals:  normal  ST/T Wave abnormalities: normal  Conduction Disutrbances:none  Narrative Interpretation: Sinus tachycardia  Old EKG Reviewed: none available  I have personally reviewed the EKG tracing and agree with the computerized printout as noted.   MDM   Final diagnoses:  Seizure   Sydney [name] presents with seizure like activity.  She is alert and oriented on my exam without focal neurologic deficits. She denies pain or discomfort. No trauma to her head or midline cervical tenderness.  Will check  labs and monitor.  Patient is not taking blood thinners. No midline cervical tenderness.  8:45PM Labs reassuring.  No evidence of infection to lower seizure threshold. She has remained alert and oriented throughout her time here in the emergency department. She has remained without focal neurologic deficit.  Patient with a history of seizure not on anticonvulsive.  Patient reports that previous seizures or "behavior related."  Patient ambulates without difficulty here in the emergency department. She wishes for discharge home and remains well-appearing. Discussed need for further evaluation of her seizure. Recommend close follow-up with her neurologist at [facility]. Patient is without headache or vision changes, doubt brain tumor.  Discussed reasons to return immediately to the emergency department as detailed in her discharge instructions.  BP 133/66 mmHg  Pulse 77  Temp(Src) 98.4 F (36.9 C) (Oral)  Resp 20  Ht  (1.626 m)  Wt 185 lb (83.915 kg)  BMI 31.74 kg/m2  SpO2 99%  LMP 02/07/2015    Dierdre Forth, PA-C 02/26/15 0101  Bethann Berkshire, MD 02/26/15 [phone]

## 2015-02-25 NOTE — Discharge Instructions (Signed)
1. Medications: usual home medications 2. Treatment: rest, drink plenty of fluids,  3. Follow Up: Please followup with your neurologist in 2 days for discussion of your diagnoses and further evaluation after today's visit; if you do not have a primary care doctor use the resource guide provided to find one; Please return to the ER for repeat seizures, worsening symptoms or other concerns   Nonepileptic Seizures Nonepileptic seizures are seizures that are not caused by abnormal electrical signals in your brain. These seizures often seem like epileptic seizures, but they are not caused by epilepsy.  There are two types of nonepileptic seizures:  A physiologic nonepileptic seizure results from a disruption in your brain.  A psychogenic seizure results from emotional stress. These seizures are sometimes called pseudoseizures. CAUSES  Causes of physiologic nonepileptic seizures include:   Sudden drop in blood pressure.  Low blood sugar.  Low levels of salt (sodium) in your blood.  Low levels of calcium in your blood.  Migraine.  Heart rhythm problems.  Sleep disorders.  Drug and alcohol abuse. Common causes of psychogenic nonepileptic seizures include:  Stress.  Emotional trauma.  Sexual or physical abuse.  Major life events, such as divorce or the death of a loved one.  Mental health disorders, including panic attack and hyperactivity disorder. SIGNS AND SYMPTOMS A nonepileptic seizure can look like an epileptic seizure, including uncontrollable shaking (convulsions), or changes in attention, behavior, or the ability to remain awake and alert. However, there are some differences. Nonepileptic seizures usually:  Do not cause physical injuries.  Start slowly.  Include crying or shrieking.  Last longer than 2 minutes.  Have a short recovery time without headache or exhaustion. DIAGNOSIS  Your health care provider can usually diagnose nonepileptic seizures after taking  your medical history and giving you a physical exam. Your health care provider may want to talk to your friends or relatives who have seen you have a seizure.  You may also need to have tests to look for causes of physiologic nonepileptic seizures. This may include an electroencephalogram (EEG), which is a test that measures electrical activity in your brain. If you have had an epileptic seizure, the results of your EEG will be abnormal. If your health care provider thinks you have had a psychogenic nonepileptic seizure, you may need to see a mental health specialist for an evaluation. TREATMENT  Treatment depends on the type and cause of your seizures.  For physiologic nonepileptic seizures, treatment is aimed at addressing the underlying condition that caused the seizures. These seizures usually stop when the underlying condition is properly treated.  Nonepileptic seizures do not respond to the seizure medicines used to treat epilepsy.  For psychogenic seizures, you may need to work with a mental health specialist. HOME CARE INSTRUCTIONS Home care will depend on the type of nonepileptic seizures you have.   Follow all your health care provider's instructions.  Keep all your follow-up appointments. SEEK MEDICAL CARE IF: You continue to have seizures after treatment. SEEK IMMEDIATE MEDICAL CARE IF:  Your seizures change or become more frequent.  You injure yourself during a seizure.  You have one seizure after another.  You have trouble recovering from a seizure.  You have chest pain or trouble breathing. MAKE SURE YOU:  Understand these instructions.  Will watch your condition.  Will get help right away if you are not doing well or get worse. Document Released: 10/31/2005 Document Revised: 01/30/2014 Document Reviewed: 07/12/2013 [facility] Patient Information 2015 [employer] [place].  This information is not intended to replace advice given to you by your health care provider. Make  sure you discuss any questions you have with your health care provider.

## 2015-02-25 NOTE — ED Notes (Signed)
Pt was able to ambulate independently w/ a steady gait.

## 2015-02-25 NOTE — ED Notes (Signed)
Bed: FA21WA15 Expected date: 02/25/15 Expected time: 5:55 PM Means of arrival: Ambulance Comments: Seizure witnessed

## 2015-03-28 ENCOUNTER — Emergency Department (HOSPITAL_COMMUNITY)
Admission: EM | Admit: 2015-03-28 | Discharge: 2015-03-28 | Disposition: A | Discharge status: Home / Self Care | Payer: 59 | Attending: Emergency Medicine | Admitting: Emergency Medicine

## 2015-03-28 ENCOUNTER — Encounter (HOSPITAL_COMMUNITY): Payer: Self-pay | Admitting: Vascular Surgery

## 2015-03-28 DIAGNOSIS — Z720 Tobacco use: Secondary | ICD-10-CM | POA: Diagnosis not present

## 2015-03-28 DIAGNOSIS — R569 Unspecified convulsions: Secondary | ICD-10-CM | POA: Diagnosis present

## 2015-03-28 LAB — BASIC METABOLIC PANEL
ANION GAP: 8 (ref 5–15)
CHLORIDE: 106 mmol/L (ref 101–111)
CO2: 24 mmol/L (ref 22–32)
CREATININE: 0.76 mg/dL (ref 0.44–1.00)
Calcium: 8.9 mg/dL (ref 8.9–10.3)
GFR calc non Af Amer: >60 mL/min (ref >60)
Glucose, Bld: 91 mg/dL (ref 65–99)
POTASSIUM: 3.5 mmol/L (ref 3.5–5.1)
Sodium: 138 mmol/L (ref 135–145)

## 2015-03-28 NOTE — ED Provider Notes (Signed)
CSN: [id number]     Arrival date & time 03/28/15  1333 History   First MD Initiated Contact with Patient 03/28/15 1337     Chief Complaint  Patient presents with  . Seizures     (Consider location/radiation/quality/duration/timing/severity/associated sxs/prior Treatment) HPI Comments: Patient with history of seizure disorder presents with complaint of seizure. Patient had seizures up until approximately age 24. She was previously on an anti-epileptic that she cannot remember the name of. She has been off of that medication for several years ago. One to 2 months ago patient began having seizures again. She has had a total 4 in that time. Patient had a seizure today lasting approximately 2 minutes followed by a postictal period. She felt lightheaded prior and knew that she was about to have a seizure. Patient was at work and she was tended to by coworkers. No lateral tongue biting or urinary incontinence. Two friends at bedside did not see seizure today, but describe prior seizures as being generalized shaking which occurs for about a minute. Patient was confused after the episode. Patient is not currently on antiepileptics. She has not seen a doctor or neurologist since her seizures started again. She thinks that they're related to not eating and drinking well as well as not sleeping well. Patient denies signs of stroke including: facial droop, slurred speech, aphasia, weakness/numbness in extremities, imbalance/trouble walking.   The history is provided by the patient.    History reviewed. No pertinent past medical history. History reviewed. No pertinent past surgical history. Family History  Problem Relation Age of Onset  . Hypertension Other   . Hyperlipidemia Other    History  Substance Use Topics  . Smoking status: Current Every Day Smoker    Types: Cigarettes  . Smokeless tobacco: Not on file  . Alcohol Use: Yes   OB History    No data available     Review of Systems   Constitutional: Negative for fever.  HENT: Negative for congestion, dental problem, rhinorrhea and sinus pressure.   Eyes: Negative for photophobia, discharge, redness and visual disturbance.  Respiratory: Negative for shortness of breath.   Cardiovascular: Negative for chest pain.  Gastrointestinal: Negative for nausea and vomiting.  Musculoskeletal: Negative for gait problem, neck pain and neck stiffness.  Skin: Negative for rash.  Neurological: Positive for seizures. Negative for speech difficulty, weakness, light-headedness, numbness and headaches.  Psychiatric/Behavioral: Negative for confusion.      Allergies  Review of patient's allergies indicates no known allergies.  Home Medications   Prior to Admission medications   Medication Sig Start Date End Date Taking? Authorizing Provider  HYDROcodone-acetaminophen (NORCO/VICODIN) 5-325 MG per tablet Take 1-2 tablets by mouth every 4 (four) hours as needed. Patient not taking: Reported on 02/25/2015 01/27/15   Fayrene Helper, PA-C  ibuprofen (ADVIL,MOTRIN) 800 MG tablet Take 1 tablet (800 mg total) by mouth every 8 (eight) hours as needed for pain. Patient not taking: Reported on 03/28/2015 10/03/12   Charlestine Night, PA-C  predniSONE (DELTASONE) 20 MG tablet 3 tabs po day one, then 2 tabs daily x 4 days Patient not taking: Reported on 02/25/2015 01/27/15   Fayrene Helper, PA-C   BP 118/74 mmHg  Pulse 85  Temp(Src) 99 F (37.2 C) (Oral)  Resp 12  SpO2 97%   Physical Exam  Constitutional: She is oriented to person, place, and time. She appears well-developed and well-nourished.  HENT:  Head: Normocephalic and atraumatic.  Right Ear: Tympanic membrane, external ear and ear canal normal.  Left Ear: Tympanic membrane, external ear and ear canal normal.  Nose: Nose normal.  Mouth/Throat: Uvula is midline, oropharynx is clear and moist and mucous membranes are normal.  Eyes: Conjunctivae, EOM and lids are normal. Pupils are equal, round,  and reactive to light. Right eye exhibits no nystagmus. Left eye exhibits no nystagmus.  Neck: Normal range of motion. Neck supple.  Cardiovascular: Normal rate and regular rhythm.   Pulmonary/Chest: Effort normal and breath sounds normal.  Abdominal: Soft. There is no tenderness.  Musculoskeletal:       Cervical back: She exhibits normal range of motion, no tenderness and no bony tenderness.  Neurological: She is alert and oriented to person, place, and time. She has normal strength and normal reflexes. No cranial nerve deficit or sensory deficit. She displays a negative Romberg sign. Coordination and gait normal. GCS eye subscore is 4. GCS verbal subscore is 5. GCS motor subscore is 6.  Skin: Skin is warm and dry.  Psychiatric: She has a normal mood and affect.  Nursing note and vitals reviewed.   ED Course  Procedures (including critical care time) Labs Review Labs Reviewed  BASIC METABOLIC PANEL - Abnormal; Notable for the following:    BUN <5 (*)    All other components within normal limits    Imaging Review No results found.   EKG Interpretation   Date/Time:  Wednesday March 28 2015 13:34:41 EDT Ventricular Rate:  80 PR Interval:  161 QRS Duration: 72 QT Interval:  370 QTC Calculation: 427 R Axis:   40 Text Interpretation:  Sinus rhythm No significant change since last  tracing Confirmed by Denton LankSTEINL  MD, Caryn BeeKEVIN ([id number]) on 03/28/2015 2:01:40 PM       Patient seen and examined. Work-up initiated.   Vital signs reviewed and are as follows: BP 118/74 mmHg  Pulse 85  Temp(Src) 99 F (37.2 C) (Oral)  Resp 12  SpO2 97%  I discussed starting antiepileptics with the patient. She became tearful when talking about these medications. She states that she would rather not begin these at this time and would rather follow-up with a neurologist.  I informed the patient that labs were normal. I once again asked her if she would like to be started on antiepileptics and she continues  to decline. We discussed that she should not be driving until she is cleared to do so by a neurologist. [facility]. Patient states that she has seen Dr. Sharene SkeansHickling when she was a child.   Patient encouraged to return with worsening symptoms, seizures with any kind of different features, headache, weakness in arms or legs, confusion, difficulty speaking or other concerns. Patient verbalizes understanding and agrees with this plan.  MDM   Final diagnoses:  Seizure   Patient with history of seizure disorder, now with recurrent seizures. This is her fourth seizure in the past 1-2 months. Offered antiepileptics today and patient declines. She is back to her baseline. No further seizure activity in the emergency department. Electrolytes are normal. Feel that patient is stable for discharge to home with neurology follow-up. Referrals given. Appropriate return instructions discussed with patient at bedside.   Renne CriglerJoshua Ernestyne Caldwell, PA-C 03/28/15 1603  Cathren LaineKevin Steinl, MD 03/29/15 [phone]

## 2015-03-28 NOTE — ED Notes (Signed)
Pt reports to the ED following a reported seizure. Per patient she was standing and she had seizure like activity. She was shaking while she was standing. No fall or injury. Hx of seizures. She was lethargic on EMS arrival but she is alert now. No incontinence or tongue injury noted. She sat down after the episode. VSS en route. CBG 94 mg/dl. Pt A&Ox4, resp e/u, and skin warm and dry.

## 2015-03-28 NOTE — Discharge Instructions (Signed)
Please read and follow all provided instructions.  Your diagnoses today include:  1. Seizure    Tests performed today include:  Electrolytes - normal  Vital signs. See below for your results today.   Medications prescribed:   None  Take any prescribed medications only as directed.  Home care instructions:  Follow any educational materials contained in this packet.  Follow-up instructions: Please follow-up with one of the neurologists listed for further evaluation of your symptoms.   Return instructions:  SEEK IMMEDIATE MEDICAL ATTENTION IF:  There is confusion or drowsiness (although children frequently become drowsy after injury).   You cannot awaken the injured person.   You have more than one episode of vomiting.   You notice dizziness or unsteadiness which is getting worse, or inability to walk.   You have convulsions or unconsciousness.   You experience severe, persistent headaches not relieved by Tylenol.  You cannot use arms or legs normally.   There are changes in pupil sizes. (This is the black center in the colored part of the eye)   There is clear or bloody discharge from the nose or ears.   You have change in speech, vision, swallowing, or understanding.   Localized weakness, numbness, tingling, or change in bowel or bladder control.  You have any other emergent concerns.  Your vital signs today were: BP 118/74 mmHg   Pulse 85   Temp(Src) 99 F (37.2 C) (Oral)   Resp 12   SpO2 97% If your blood pressure (BP) was elevated above 135/85 this visit, please have this repeated by your doctor within one month. --------------

## 2015-03-28 NOTE — ED Notes (Signed)
Pt placed in gown and in bed. Pt monitored by pulse ox, bp cuff, and 12-lead. Sz pads placed on rails of bed.

## 2015-04-27 ENCOUNTER — Emergency Department (HOSPITAL_COMMUNITY): Payer: 59

## 2015-04-27 ENCOUNTER — Encounter (HOSPITAL_COMMUNITY): Payer: Self-pay | Admitting: Radiology

## 2015-04-27 ENCOUNTER — Emergency Department (HOSPITAL_COMMUNITY)
Admission: EM | Admit: 2015-04-27 | Discharge: 2015-04-27 | Disposition: A | Discharge status: Home / Self Care | Payer: 59 | Attending: Emergency Medicine | Admitting: Emergency Medicine

## 2015-04-27 DIAGNOSIS — Y9289 Other specified places as the place of occurrence of the external cause: Secondary | ICD-10-CM | POA: Diagnosis not present

## 2015-04-27 DIAGNOSIS — Y998 Other external cause status: Secondary | ICD-10-CM | POA: Insufficient documentation

## 2015-04-27 DIAGNOSIS — S82851A Displaced trimalleolar fracture of right lower leg, initial encounter for closed fracture: Secondary | ICD-10-CM | POA: Diagnosis not present

## 2015-04-27 DIAGNOSIS — R52 Pain, unspecified: Secondary | ICD-10-CM

## 2015-04-27 DIAGNOSIS — Y9389 Activity, other specified: Secondary | ICD-10-CM | POA: Diagnosis not present

## 2015-04-27 DIAGNOSIS — S20319A Abrasion of unspecified front wall of thorax, initial encounter: Secondary | ICD-10-CM | POA: Insufficient documentation

## 2015-04-27 DIAGNOSIS — Z720 Tobacco use: Secondary | ICD-10-CM | POA: Diagnosis not present

## 2015-04-27 DIAGNOSIS — S0990XA Unspecified injury of head, initial encounter: Secondary | ICD-10-CM | POA: Diagnosis not present

## 2015-04-27 DIAGNOSIS — S99911A Unspecified injury of right ankle, initial encounter: Secondary | ICD-10-CM | POA: Diagnosis present

## 2015-04-27 HISTORY — DX: Displaced trimalleolar fracture of right lower leg, initial encounter for closed fracture: S82.851A

## 2015-04-27 MED ORDER — MORPHINE SULFATE 4 MG/ML IJ SOLN
4.0000 mg | Freq: Once | INTRAMUSCULAR | Status: AC
  Administered 2015-04-27: 4 mg via INTRAVENOUS
  Filled 2015-04-27: qty 1

## 2015-04-27 MED ORDER — OXYCODONE-ACETAMINOPHEN 5-325 MG PO TABS: 1.0000 | ORAL_TABLET | Freq: Four times a day (QID) | ORAL | Status: DC | PRN

## 2015-04-27 MED ORDER — ONDANSETRON HCL 4 MG/2ML IJ SOLN
4.0000 mg | Freq: Once | INTRAMUSCULAR | Status: AC
  Administered 2015-04-27: 4 mg via INTRAVENOUS
  Filled 2015-04-27: qty 2

## 2015-04-27 MED ORDER — SODIUM CHLORIDE 0.9 % IV BOLUS (SEPSIS)
1000.0000 mL | Freq: Once | INTRAVENOUS | Status: AC
  Administered 2015-04-27: 1000 mL via INTRAVENOUS

## 2015-04-27 NOTE — ED Notes (Signed)
Bed: WA08 Expected date:  Expected time:  Means of arrival:  Comments: EMS-ankle deformity 

## 2015-04-27 NOTE — Discharge Instructions (Signed)
It is very important for you to elevate your right leg at all times and keep your toes above your nose.  It is very important for you to follow up with the Orthopedic surgeon on Monday.  Call to schedule the appointment for Monday.  Take pain medication as prescribed.  Do not drive or operate heavy machinery for 4-6 hours after taking pain medication.
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
## 2015-04-27 NOTE — ED Notes (Signed)
Nurse, and PA at bedside discussing care. PA asking about chronic medical problems, patient states  I have seizures the last one was one week ago. Patient does not take medications. Patient had a seizure at this time, no incontinence noted. Maintained seizure precautions.

## 2015-04-30 ENCOUNTER — Encounter (HOSPITAL_BASED_OUTPATIENT_CLINIC_OR_DEPARTMENT_OTHER): Payer: Self-pay | Admitting: *Deleted

## 2015-04-30 ENCOUNTER — Other Ambulatory Visit: Payer: Self-pay | Admitting: Orthopedic Surgery

## 2015-05-03 ENCOUNTER — Ambulatory Visit (HOSPITAL_BASED_OUTPATIENT_CLINIC_OR_DEPARTMENT_OTHER)
Admission: RE | Admit: 2015-05-03 | Discharge: 2015-05-03 | Disposition: A | Discharge status: Home / Self Care | Payer: 59 | Source: Ambulatory Visit | Attending: Orthopedic Surgery | Admitting: Orthopedic Surgery

## 2015-05-03 ENCOUNTER — Ambulatory Visit (HOSPITAL_BASED_OUTPATIENT_CLINIC_OR_DEPARTMENT_OTHER): Payer: 59 | Admitting: Certified Registered"

## 2015-05-03 ENCOUNTER — Encounter (HOSPITAL_BASED_OUTPATIENT_CLINIC_OR_DEPARTMENT_OTHER)
Admission: RE | Disposition: A | Discharge status: Home / Self Care | Payer: Self-pay | Source: Ambulatory Visit | Attending: Orthopedic Surgery

## 2015-05-03 ENCOUNTER — Encounter (HOSPITAL_BASED_OUTPATIENT_CLINIC_OR_DEPARTMENT_OTHER): Payer: Self-pay | Admitting: Certified Registered"

## 2015-05-03 DIAGNOSIS — F17210 Nicotine dependence, cigarettes, uncomplicated: Secondary | ICD-10-CM | POA: Diagnosis not present

## 2015-05-03 DIAGNOSIS — X58XXXA Exposure to other specified factors, initial encounter: Secondary | ICD-10-CM | POA: Diagnosis not present

## 2015-05-03 DIAGNOSIS — S82851A Displaced trimalleolar fracture of right lower leg, initial encounter for closed fracture: Secondary | ICD-10-CM | POA: Insufficient documentation

## 2015-05-03 HISTORY — PX: ORIF ANKLE FRACTURE: SHX5408

## 2015-05-03 HISTORY — DX: Displaced trimalleolar fracture of right lower leg, initial encounter for closed fracture: S82.851A

## 2015-05-03 HISTORY — DX: Personal history of other specified conditions: Z87.898

## 2015-05-03 SURGERY — OPEN REDUCTION INTERNAL FIXATION (ORIF) ANKLE FRACTURE
Anesthesia: General | Site: Ankle | Laterality: Right

## 2015-05-03 MED ORDER — MIDAZOLAM HCL 2 MG/2ML IJ SOLN
INTRAMUSCULAR | Status: AC
  Filled 2015-05-03: qty 2

## 2015-05-03 MED ORDER — OXYCODONE HCL 5 MG/5ML PO SOLN: 5.0000 mg | Freq: Once | ORAL | Status: AC | PRN

## 2015-05-03 MED ORDER — SENNOSIDES 8.6 MG PO TABS: 2.0000 | ORAL_TABLET | Freq: Every day | ORAL | Status: DC

## 2015-05-03 MED ORDER — DEXAMETHASONE SODIUM PHOSPHATE 10 MG/ML IJ SOLN
INTRAMUSCULAR | Status: DC | PRN
  Administered 2015-05-03: 10 mg via INTRAVENOUS

## 2015-05-03 MED ORDER — OXYCODONE HCL 5 MG PO TABS
ORAL_TABLET | ORAL | Status: AC
  Filled 2015-05-03: qty 1

## 2015-05-03 MED ORDER — MEPERIDINE HCL 25 MG/ML IJ SOLN: 6.2500 mg | INTRAMUSCULAR | Status: DC | PRN

## 2015-05-03 MED ORDER — CEFAZOLIN SODIUM-DEXTROSE 2-3 GM-% IV SOLR
2.0000 g | INTRAVENOUS | Status: AC
  Administered 2015-05-03: 2 g via INTRAVENOUS

## 2015-05-03 MED ORDER — SCOPOLAMINE 1 MG/3DAYS TD PT72: 1.0000 | MEDICATED_PATCH | Freq: Once | TRANSDERMAL | Status: DC | PRN

## 2015-05-03 MED ORDER — FENTANYL CITRATE (PF) 100 MCG/2ML IJ SOLN
50.0000 ug | INTRAMUSCULAR | Status: AC | PRN
  Administered 2015-05-03 (×4): 25 ug via INTRAVENOUS
  Administered 2015-05-03: 150 ug via INTRAVENOUS
  Administered 2015-05-03 (×2): 50 ug via INTRAVENOUS

## 2015-05-03 MED ORDER — FENTANYL CITRATE (PF) 100 MCG/2ML IJ SOLN
INTRAMUSCULAR | Status: AC
  Filled 2015-05-03: qty 2

## 2015-05-03 MED ORDER — MIDAZOLAM HCL 2 MG/2ML IJ SOLN
1.0000 mg | INTRAMUSCULAR | Status: DC | PRN
  Administered 2015-05-03: 2 mg via INTRAVENOUS
  Administered 2015-05-03: 1 mg via INTRAVENOUS

## 2015-05-03 MED ORDER — FENTANYL CITRATE (PF) 100 MCG/2ML IJ SOLN
INTRAMUSCULAR | Status: AC
  Filled 2015-05-03: qty 6

## 2015-05-03 MED ORDER — ROPIVACAINE HCL 5 MG/ML IJ SOLN
INTRAMUSCULAR | Status: DC | PRN
  Administered 2015-05-03 (×2): 10 mL via PERINEURAL

## 2015-05-03 MED ORDER — 0.9 % SODIUM CHLORIDE (POUR BTL) OPTIME
TOPICAL | Status: DC | PRN
  Administered 2015-05-03: 200 mL

## 2015-05-03 MED ORDER — LIDOCAINE HCL (CARDIAC) 20 MG/ML IV SOLN
INTRAVENOUS | Status: DC | PRN
  Administered 2015-05-03: 30 mg via INTRAVENOUS

## 2015-05-03 MED ORDER — HYDROMORPHONE HCL 1 MG/ML IJ SOLN
INTRAMUSCULAR | Status: AC
Start: 2015-05-03 — End: 2015-05-03
  Filled 2015-05-03: qty 1

## 2015-05-03 MED ORDER — OXYCODONE HCL 5 MG PO TABS
5.0000 mg | ORAL_TABLET | Freq: Once | ORAL | Status: AC | PRN
  Administered 2015-05-03: 5 mg via ORAL

## 2015-05-03 MED ORDER — CEFAZOLIN SODIUM-DEXTROSE 2-3 GM-% IV SOLR
INTRAVENOUS | Status: AC
  Filled 2015-05-03: qty 50

## 2015-05-03 MED ORDER — OXYCODONE HCL 5 MG PO TABS: 5.0000 mg | ORAL_TABLET | ORAL | Status: DC | PRN

## 2015-05-03 MED ORDER — GLYCOPYRROLATE 0.2 MG/ML IJ SOLN: 0.2000 mg | Freq: Once | INTRAMUSCULAR | Status: DC | PRN

## 2015-05-03 MED ORDER — BUPIVACAINE-EPINEPHRINE (PF) 0.5% -1:200000 IJ SOLN
INTRAMUSCULAR | Status: DC | PRN
  Administered 2015-05-03 (×2): 10 mL via PERINEURAL

## 2015-05-03 MED ORDER — SODIUM CHLORIDE 0.9 % IV SOLN: INTRAVENOUS | Status: DC

## 2015-05-03 MED ORDER — LACTATED RINGERS IV SOLN
INTRAVENOUS | Status: DC
  Administered 2015-05-03 (×2): via INTRAVENOUS

## 2015-05-03 MED ORDER — ENOXAPARIN SODIUM 40 MG/0.4ML ~~LOC~~ SOLN: 40.0000 mg | SUBCUTANEOUS | Status: DC

## 2015-05-03 MED ORDER — HYDROMORPHONE HCL 1 MG/ML IJ SOLN
INTRAMUSCULAR | Status: AC
  Filled 2015-05-03: qty 1

## 2015-05-03 MED ORDER — PROPOFOL 10 MG/ML IV BOLUS
INTRAVENOUS | Status: DC | PRN
  Administered 2015-05-03: 200 mg via INTRAVENOUS

## 2015-05-03 MED ORDER — CHLORHEXIDINE GLUCONATE 4 % EX LIQD: 60.0000 mL | Freq: Once | CUTANEOUS | Status: DC

## 2015-05-03 MED ORDER — ONDANSETRON HCL 4 MG/2ML IJ SOLN
INTRAMUSCULAR | Status: DC | PRN
  Administered 2015-05-03: 4 mg via INTRAVENOUS

## 2015-05-03 MED ORDER — BUPIVACAINE-EPINEPHRINE (PF) 0.5% -1:200000 IJ SOLN
INTRAMUSCULAR | Status: AC
  Filled 2015-05-03: qty 30

## 2015-05-03 MED ORDER — HYDROMORPHONE HCL 1 MG/ML IJ SOLN
0.2500 mg | INTRAMUSCULAR | Status: DC | PRN
  Administered 2015-05-03 (×4): 0.5 mg via INTRAVENOUS

## 2015-05-03 MED ORDER — BUPIVACAINE-EPINEPHRINE 0.5% -1:200000 IJ SOLN
INTRAMUSCULAR | Status: DC | PRN
  Administered 2015-05-03: 30 mL

## 2015-05-03 MED ORDER — DOCUSATE SODIUM 100 MG PO CAPS: 100.0000 mg | ORAL_CAPSULE | Freq: Two times a day (BID) | ORAL | Status: DC

## 2015-05-03 SURGICAL SUPPLY — 81 items
BANDAGE ESMARK 6X9 LF (GAUZE/BANDAGES/DRESSINGS) ×1 IMPLANT
BIT DRILL 2.5X2.75 QC CALB (BIT) ×4 IMPLANT
BIT DRILL 3.5X5.5 QC CALB (BIT) ×4 IMPLANT
BLADE SURG 15 STRL LF DISP TIS (BLADE) ×2 IMPLANT
BLADE SURG 15 STRL SS (BLADE) ×6
BNDG CMPR 9X4 STRL LF SNTH (GAUZE/BANDAGES/DRESSINGS)
BNDG CMPR 9X6 STRL LF SNTH (GAUZE/BANDAGES/DRESSINGS) ×1
BNDG COHESIVE 4X5 TAN STRL (GAUZE/BANDAGES/DRESSINGS) ×3 IMPLANT
BNDG COHESIVE 6X5 TAN STRL LF (GAUZE/BANDAGES/DRESSINGS) ×3 IMPLANT
BNDG ESMARK 4X9 LF (GAUZE/BANDAGES/DRESSINGS) IMPLANT
BNDG ESMARK 6X9 LF (GAUZE/BANDAGES/DRESSINGS) ×3
CANISTER SUCT 1200ML W/VALVE (MISCELLANEOUS) ×3 IMPLANT
CHLORAPREP W/TINT 26ML (MISCELLANEOUS) ×3 IMPLANT
COVER BACK TABLE 60X90IN (DRAPES) ×3 IMPLANT
CUFF TOURNIQUET SINGLE 34IN LL (TOURNIQUET CUFF) ×3 IMPLANT
DECANTER SPIKE VIAL GLASS SM (MISCELLANEOUS) IMPLANT
DRAPE EXTREMITY T 121X128X90 (DRAPE) ×3 IMPLANT
DRAPE OEC MINIVIEW 54X84 (DRAPES) ×3 IMPLANT
DRAPE U-SHAPE 47X51 STRL (DRAPES) ×3 IMPLANT
DRSG ADAPTIC 3X8 NADH LF (GAUZE/BANDAGES/DRESSINGS) IMPLANT
DRSG EMULSION OIL 3X3 NADH (GAUZE/BANDAGES/DRESSINGS) IMPLANT
DRSG MEPITEL 4X7.2 (GAUZE/BANDAGES/DRESSINGS) ×2 IMPLANT
DRSG PAD ABDOMINAL 8X10 ST (GAUZE/BANDAGES/DRESSINGS) ×6 IMPLANT
ELECT REM PT RETURN 9FT ADLT (ELECTROSURGICAL) ×3
ELECTRODE REM PT RTRN 9FT ADLT (ELECTROSURGICAL) ×1 IMPLANT
GAUZE SPONGE 4X4 12PLY STRL (GAUZE/BANDAGES/DRESSINGS) ×3 IMPLANT
GLOVE BIO SURGEON STRL SZ8 (GLOVE) ×3 IMPLANT
GLOVE BIOGEL PI IND STRL 7.0 (GLOVE) IMPLANT
GLOVE BIOGEL PI IND STRL 8 (GLOVE) ×2 IMPLANT
GLOVE BIOGEL PI INDICATOR 7.0 (GLOVE) ×2
GLOVE BIOGEL PI INDICATOR 8 (GLOVE) ×2
GLOVE ECLIPSE 6.5 STRL STRAW (GLOVE) ×2 IMPLANT
GLOVE ECLIPSE 7.5 STRL STRAW (GLOVE) ×1 IMPLANT
GLOVE EXAM NITRILE MD LF STRL (GLOVE) ×2 IMPLANT
GOWN STRL REUS W/ TWL LRG LVL3 (GOWN DISPOSABLE) ×1 IMPLANT
GOWN STRL REUS W/ TWL XL LVL3 (GOWN DISPOSABLE) ×2 IMPLANT
GOWN STRL REUS W/TWL LRG LVL3 (GOWN DISPOSABLE) ×3
GOWN STRL REUS W/TWL XL LVL3 (GOWN DISPOSABLE) ×3
K-WIRE ACE 1.6X6 (WIRE) ×6
KWIRE ACE 1.6X6 (WIRE) IMPLANT
NEEDLE HYPO 22GX1.5 SAFETY (NEEDLE) ×2 IMPLANT
NS IRRIG 1000ML POUR BTL (IV SOLUTION) ×3 IMPLANT
PACK BASIN DAY SURGERY FS (CUSTOM PROCEDURE TRAY) ×3 IMPLANT
PAD CAST 4YDX4 CTTN HI CHSV (CAST SUPPLIES) ×1 IMPLANT
PADDING CAST ABS 4INX4YD NS (CAST SUPPLIES)
PADDING CAST ABS COTTON 4X4 ST (CAST SUPPLIES) IMPLANT
PADDING CAST COTTON 4X4 STRL (CAST SUPPLIES) ×3
PADDING CAST COTTON 6X4 STRL (CAST SUPPLIES) ×3 IMPLANT
PENCIL BUTTON HOLSTER BLD 10FT (ELECTRODE) ×3 IMPLANT
PLATE ACE 100DEG 6HOLE (Plate) ×2 IMPLANT
SANITIZER HAND PURELL 535ML FO (MISCELLANEOUS) ×3 IMPLANT
SCREW ACE CAN 4.0 40M (Screw) ×4 IMPLANT
SCREW CORTICAL 3.5MM  12MM (Screw) ×2 IMPLANT
SCREW CORTICAL 3.5MM  16MM (Screw) ×6 IMPLANT
SCREW CORTICAL 3.5MM  20MM (Screw) ×2 IMPLANT
SCREW CORTICAL 3.5MM 12MM (Screw) IMPLANT
SCREW CORTICAL 3.5MM 14MM (Screw) ×4 IMPLANT
SCREW CORTICAL 3.5MM 16MM (Screw) IMPLANT
SCREW CORTICAL 3.5MM 20MM (Screw) IMPLANT
SHEET MEDIUM DRAPE 40X70 STRL (DRAPES) ×3 IMPLANT
SLEEVE SCD COMPRESS KNEE MED (MISCELLANEOUS) ×3 IMPLANT
SPLINT FAST PLASTER 5X30 (CAST SUPPLIES) ×40
SPLINT PLASTER CAST FAST 5X30 (CAST SUPPLIES) ×20 IMPLANT
SPONGE LAP 18X18 X RAY DECT (DISPOSABLE) ×3 IMPLANT
STOCKINETTE 6  STRL (DRAPES) ×2
STOCKINETTE 6 STRL (DRAPES) ×1 IMPLANT
SUCTION FRAZIER TIP 10 FR DISP (SUCTIONS) ×3 IMPLANT
SUT ETHILON 3 0 PS 1 (SUTURE) ×5 IMPLANT
SUT FIBERWIRE #2 38 T-5 BLUE (SUTURE)
SUT MNCRL AB 3-0 PS2 18 (SUTURE) ×5 IMPLANT
SUT VIC AB 0 SH 27 (SUTURE) IMPLANT
SUT VIC AB 2-0 SH 27 (SUTURE) ×3
SUT VIC AB 2-0 SH 27XBRD (SUTURE) IMPLANT
SUT VICRYL 4-0 PS2 18IN ABS (SUTURE) IMPLANT
SUTURE FIBERWR #2 38 T-5 BLUE (SUTURE) IMPLANT
SYR BULB 3OZ (MISCELLANEOUS) ×3 IMPLANT
SYR CONTROL 10ML LL (SYRINGE) ×2 IMPLANT
TOWEL OR 17X24 6PK STRL BLUE (TOWEL DISPOSABLE) ×6 IMPLANT
TUBE CONNECTING 20'X1/4 (TUBING) ×1
TUBE CONNECTING 20X1/4 (TUBING) ×2 IMPLANT
UNDERPAD 30X30 (UNDERPADS AND DIAPERS) ×3 IMPLANT
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
## 2015-05-03 NOTE — Anesthesia Procedure Notes (Addendum)
Anesthesia Regional Block:  Popliteal block  Pre-Anesthetic Checklist: ,, timeout performed, Correct Patient, Correct Site, Correct Laterality, Correct Procedure, Correct Position, site marked, Risks and benefits discussed,  Surgical consent,  Pre-op evaluation,  At surgeon's request and post-op pain management  Laterality: Right and Lower  Prep: chloraprep       Needles:  Injection technique: Single-shot  Needle Type: Echogenic Needle     Needle Length: 9cm 9 cm Needle Gauge: 21 and 21 G    Additional Needles:  Procedures: ultrasound guided (picture in chart) Popliteal block Narrative:  Start time: 05/03/2015 12:32 PM End time: 05/03/2015 12:36 PM Injection made incrementally with aspirations every 5 mL.  Performed by: Personally  Anesthesiologist: CREWS, DAVID   Anesthesia Regional Block:  Adductor canal block  Pre-Anesthetic Checklist: ,, timeout performed, Correct Patient, Correct Site, Correct Laterality, Correct Procedure, Correct Position, site marked, Risks and benefits discussed,  Surgical consent,  Pre-op evaluation,  At surgeon's request and post-op pain management  Laterality: Right and Lower  Prep: chloraprep       Needles:  Injection technique: Single-shot  Needle Type: Echogenic Needle     Needle Length: 9cm 9 cm Needle Gauge: 21 and 21 G    Additional Needles:  Procedures: ultrasound guided (picture in chart) Adductor canal block Narrative:  Start time: 05/03/2015 12:36 PM End time: 05/03/2015 12:40 PM Injection made incrementally with aspirations every 5 mL.  Performed by: Personally  Anesthesiologist: CREWS, DAVID   Procedure Name: LMA Insertion Date/Time: 05/03/2015 12:50 PM Performed by: Json Koelzer D Pre-anesthesia Checklist: Patient identified, Emergency Drugs available, Suction available and Patient being monitored Patient Re-evaluated:Patient Re-evaluated prior to inductionOxygen Delivery Method: Circle System  Utilized Preoxygenation: Pre-oxygenation with 100% oxygen Intubation Type: IV induction Ventilation: Mask ventilation without difficulty LMA: LMA inserted LMA Size: 4.0 Number of attempts: 1 Airway Equipment and Method: Bite block Placement Confirmation: positive ETCO2 Tube secured with: Tape Dental Injury: Teeth and Oropharynx as per pre-operative assessment

## 2015-05-03 NOTE — Anesthesia Preprocedure Evaluation (Signed)
Anesthesia Evaluation  Patient identified by MRN, date of birth, ID band Patient awake    Reviewed: Allergy & Precautions, NPO status , Patient's Chart, lab work & pertinent test results  Airway Mallampati: I  TM Distance: >3 FB Neck ROM: Full    Dental  (+) Teeth Intact, Dental Advisory Given   Pulmonary Current Smoker,  breath sounds clear to auscultation        Cardiovascular Rhythm:Regular Rate:Normal     Neuro/Psych    GI/Hepatic   Endo/Other    Renal/GU      Musculoskeletal   Abdominal   Peds  Hematology   Anesthesia Other Findings   Reproductive/Obstetrics                             Anesthesia Physical Anesthesia Plan  ASA: II  Anesthesia Plan: General   Post-op Pain Management:    Induction: Intravenous  Airway Management Planned: LMA  Additional Equipment:   Intra-op Plan:   Post-operative Plan: Extubation in OR  Informed Consent: I have reviewed the patients History and Physical, chart, labs and discussed the procedure including the risks, benefits and alternatives for the proposed anesthesia with the patient or authorized representative who has indicated his/her understanding and acceptance.   Dental advisory given  Plan Discussed with: CRNA, Anesthesiologist and Surgeon  Anesthesia Plan Comments:         Anesthesia Quick Evaluation  
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
## 2015-05-03 NOTE — Progress Notes (Signed)
Assisted Dr. Crews with right, ultrasound guided, popliteal/saphenous block. Side rails up, monitors on throughout procedure. See vital signs in flow sheet. Tolerated Procedure well. 

## 2015-05-03 NOTE — Brief Op Note (Signed)
05/03/2015  2:06 PM  PATIENT:  Sydney [name]  24 y.o. female  PRE-OPERATIVE DIAGNOSIS:  right ankle trimalleolar fracture  POST-OPERATIVE DIAGNOSIS:  right ankle trimalleolar fracture  Procedure(s): 1.  Open REDUCTION INTERNAL FIXATION (ORIF)RIGHT ANKLE TRIMALLEOLAR FRACTURE without fixation of posterior lip 2.  AP, mortise and lateral xrays of the right ankle 3.  Stress exam of the right ankle under fluoro  SURGEON:  [name], MD  ASSISTANT: n/a  ANESTHESIA:   General, regional  EBL:  minimal   TOURNIQUET:   Total Tourniquet Time Documented: Thigh (Right) - 49 minutes Total: Thigh (Right) - 49 minutes  COMPLICATIONS:  None apparent  DISPOSITION:  Extubated, awake and stable to recovery.  DICTATION ID:  [id number]

## 2015-05-03 NOTE — Transfer of Care (Signed)
Immediate Anesthesia Transfer of Care Note  Patient: Sydney [name]  Procedure(s) Performed: Procedure(s): OPEN REDUCTION INTERNAL FIXATION (ORIF)RIGHT ANKLE TRIMALLEOLAR FRACTURE (Right)  Patient Location: PACU  Anesthesia Type:GA combined with regional for post-op pain  Level of Consciousness: awake, alert , oriented and patient cooperative  Airway & Oxygen Therapy: Patient Spontanous Breathing and Patient connected to face mask oxygen  Post-op Assessment: Report given to RN and Post -op Vital signs reviewed and stable  Post vital signs: Reviewed and stable  Last Vitals:  Filed Vitals:   05/03/15 1402  BP:   Pulse: 88  Temp:   Resp:     Complications: No apparent anesthesia complications

## 2015-05-03 NOTE — Discharge Instructions (Signed)
[name], MD [facility]  Please read the following information regarding your care after surgery.  Medications  You only need a prescription for the narcotic pain medicine (ex. oxycodone, Percocet, Norco).  All of the other medicines listed below are available over the counter. X acetominophen (Tylenol) 650 mg every 4-6 hours as you need for minor pain X oxycodone as prescribed for moderate to severe pain X Aleve 2 pills twice a day for 5 days  Narcotic pain medicine (ex. oxycodone, Percocet, Vicodin) will cause constipation.  To prevent this problem, take the following medicines while you are taking any pain medicine. X docusate sodium (Colace) 100 mg twice a day X senna (Senokot) 2 tablets twice a day  X To help prevent blood clots, take Lovenox shots for 2 weeks after surgery.  Then take an aspirin (325 mg) once a day for a month.  You should also get up every hour while you are awake to move around.    Weight Bearing ? Bear weight when you are able on your operated leg or foot. ? Bear weight only on the heel of your operated foot in the post-op shoe. X Do not bear any weight on the operated leg or foot.  Cast / Splint / Dressing X Keep your splint or cast clean and dry.  Dont put anything (coat hanger, pencil, etc) down inside of it.  If it gets damp, use a hair dryer on the cool setting to dry it.  If it gets soaked, call the office to schedule an appointment for a cast change. ? Remove your dressing 3 days after surgery and cover the incisions with dry dressings.    After your dressing, cast or splint is removed; you may shower, but do not soak or scrub the wound.  Allow the water to run over it, and then gently pat it dry.  Swelling It is normal for you to have swelling where you had surgery.  To reduce swelling and pain, keep your toes above your nose for at least 3 days after surgery.  It may be necessary to keep your foot or leg elevated for several weeks.  If  it hurts, it should be elevated.  Follow Up Call my office at [phone] when you are discharged from the hospital or surgery center to schedule an appointment to be seen two weeks after surgery.  Call my office at [phone] if you develop a fever >101.5 F, nausea, vomiting, bleeding from the surgical site or severe pain.    Regional Anesthesia Blocks  1. Numbness or the inability to move the "blocked" extremity may last from 3-48 hours after placement. The length of time depends on the medication injected and your individual response to the medication. If the numbness is not going away after 48 hours, call your surgeon.  2. The extremity that is blocked will need to be protected until the numbness is gone and the  Strength has returned. Because you cannot feel it, you will need to take extra care to avoid injury. Because it may be weak, you may have difficulty moving it or using it. You may not know what position it is in without looking at it while the block is in effect.  3. For blocks in the legs and feet, returning to weight bearing and walking needs to be done carefully. You will need to wait until the numbness is entirely gone and the strength has returned. You should be able to move your leg and foot  normally before you try and bear weight or walk. You will need someone to be with you when you first try to ensure you do not fall and possibly risk injury.  4. Bruising and tenderness at the needle site are common side effects and will resolve in a few days.  5. Persistent numbness or new problems with movement should be communicated to the surgeon or the [facility] [phone] [facility] [phone]).   Post Anesthesia Home Care Instructions  Activity: Get plenty of rest for the remainder of the day. A responsible adult should stay with you for 24 hours following the procedure.  For the next 24 hours, DO NOT: -Drive a car -Social worker -Drink alcoholic beverages -Take any medication unless instructed by your physician -Make any legal decisions or sign important papers.  Meals: Start with liquid foods such as gelatin or soup. Progress to regular foods as tolerated. Avoid greasy, spicy, heavy foods. If nausea and/or vomiting occur, drink only clear liquids until the nausea and/or vomiting subsides. Call your physician if vomiting continues.  Special Instructions/Symptoms: Your throat may feel dry or sore from the anesthesia or the breathing tube placed in your throat during surgery. If this causes discomfort, gargle with warm salt water. The discomfort should disappear within 24 hours.  If you had a scopolamine patch placed behind your ear for the management of post- operative nausea and/or vomiting:  1. The medication in the patch is effective for 72 hours, after which it should be removed.  Wrap patch in a tissue and discard in the trash. Wash hands thoroughly with soap and water. 2. You may remove the patch earlier than 72 hours if you experience unpleasant side effects which may include dry mouth, dizziness or visual disturbances. 3. Avoid touching the patch. Wash your hands with soap and water after contact with the patch.

## 2015-05-03 NOTE — Anesthesia Postprocedure Evaluation (Signed)
  Anesthesia Post-op Note  Patient: Sydney [name]  Procedure(s) Performed: Procedure(s): OPEN REDUCTION INTERNAL FIXATION (ORIF)RIGHT ANKLE TRIMALLEOLAR FRACTURE (Right)  Patient Location: PACU  Anesthesia Type:General  Level of Consciousness: awake  Airway and Oxygen Therapy: Patient Spontanous Breathing  Post-op Pain: mild  Post-op Assessment: Post-op Vital signs reviewed     RLE Motor Response: No movement due to regional block RLE Sensation: Numbness, Tingling      Post-op Vital Signs: Reviewed  Last Vitals:  Filed Vitals:   05/03/15 1500  BP: 123/69  Pulse: 73  Temp:   Resp: 19    Complications: No apparent anesthesia complications

## 2015-05-04 ENCOUNTER — Encounter (HOSPITAL_BASED_OUTPATIENT_CLINIC_OR_DEPARTMENT_OTHER): Payer: Self-pay | Admitting: Orthopedic Surgery

## 2015-05-09 ENCOUNTER — Emergency Department (HOSPITAL_COMMUNITY)
Admission: EM | Admit: 2015-05-09 | Discharge: 2015-05-09 | Disposition: A | Discharge status: Home / Self Care | Payer: 59 | Attending: Emergency Medicine | Admitting: Emergency Medicine

## 2015-05-09 ENCOUNTER — Encounter (HOSPITAL_COMMUNITY): Payer: Self-pay | Admitting: Emergency Medicine

## 2015-05-09 DIAGNOSIS — Z720 Tobacco use: Secondary | ICD-10-CM | POA: Insufficient documentation

## 2015-05-09 DIAGNOSIS — R61 Generalized hyperhidrosis: Secondary | ICD-10-CM | POA: Insufficient documentation

## 2015-05-09 DIAGNOSIS — L0501 Pilonidal cyst with abscess: Secondary | ICD-10-CM | POA: Diagnosis present

## 2015-05-09 DIAGNOSIS — Z8781 Personal history of (healed) traumatic fracture: Secondary | ICD-10-CM | POA: Insufficient documentation

## 2015-05-09 MED ORDER — OXYCODONE-ACETAMINOPHEN 5-325 MG PO TABS
1.0000 | ORAL_TABLET | Freq: Once | ORAL | Status: AC
  Administered 2015-05-09: 1 via ORAL
  Filled 2015-05-09: qty 1

## 2015-05-09 MED ORDER — OXYCODONE-ACETAMINOPHEN 5-325 MG PO TABS: 1.0000 | ORAL_TABLET | ORAL | Status: DC | PRN

## 2015-05-09 MED ORDER — LIDOCAINE-EPINEPHRINE 2 %-1:100000 IJ SOLN
20.0000 mL | Freq: Once | INTRAMUSCULAR | Status: DC
  Filled 2015-05-09: qty 1

## 2015-05-09 NOTE — ED Provider Notes (Signed)
CSN: [id number]     Arrival date & time 05/09/15  1816 History  This chart was scribed for non-physician practitioner, Leigh Aurora, PA-C working with Melene Plan, DO by Placido Sou, ED scribe. This patient was seen in room WTR7/WTR7 and the patient's care was started at 8:33 PM.   No chief complaint on file.  The history is provided by the patient. No language interpreter was used.    HPI Comments: Sydney [name] is a 24 y.o. female who presents to the Emergency Department complaining of a point of swelling and pain to her upper buttocks with onset a few days ago. She notes a Hx of abscess to the same area earlier this year which alleviated itself over time and notes this pain and swelling is similar. She notes associated hot flashes and diaphoresis and denies any drainage from the affected area. Pt describes the region as hard. She denies any other associated symptoms.   Past Medical History  Diagnosis Date  . History of seizure     x 1 - unknown cause; has appt. to see neurologist the end of August  . Trimalleolar fracture of right ankle 04/27/2015   Past Surgical History  Procedure Laterality Date  . Wisdom tooth extraction    . Orif ankle fracture Right 05/03/2015    Procedure: OPEN REDUCTION INTERNAL FIXATION (ORIF)RIGHT ANKLE TRIMALLEOLAR FRACTURE;  Surgeon: [name], MD;  Location: [facility];  Service: Orthopedics;  Laterality: Right;   History reviewed. No pertinent family history. Social History  Substance Use Topics  . Smoking status: Current Every Day Smoker -- 0.33 packs/day for 2 years    Types: Cigarettes  . Smokeless tobacco: Never Used  . Alcohol Use: Yes     Comment: socially/weekends   OB History    No data available     Review of Systems  Constitutional: Positive for diaphoresis. Negative for fever and chills.  Musculoskeletal: Positive for myalgias.  Skin: Positive for color change.       C/O painful swelling to buttock.    Allergies  Review of patient's allergies indicates no known allergies.  Home Medications   Prior to Admission medications   Medication Sig Start Date End Date Taking? Authorizing Provider  docusate sodium (COLACE) 100 MG capsule Take 1 capsule (100 mg total) by mouth 2 (two) times daily. While taking narcotic pain medicine. 05/03/15   [name], MD  enoxaparin (LOVENOX) 40 MG/0.4ML injection Inject 0.4 mLs (40 mg total) into the skin daily. 05/05/15   [name], MD  oxyCODONE (ROXICODONE) 5 MG immediate release tablet Take 1-2 tablets (5-10 mg total) by mouth every 4 (four) hours as needed for moderate pain or severe pain. 05/03/15   [name], MD  senna (SENOKOT) 8.6 MG tablet Take 2 tablets (17.2 mg total) by mouth daily. While taking narcotic pain medicine. 05/03/15   [name], MD   BP 133/75 mmHg  Pulse 103  Temp(Src) 98.3 F (36.8 C) (Oral)  Resp 18  SpO2 100%  LMP 04/28/2015 (Approximate) Physical Exam  Constitutional: She is oriented to person, place, and time. She appears well-developed and well-nourished. No distress.  HENT:  Head: Normocephalic and atraumatic.  Mouth/Throat: Oropharynx is clear and moist.  Eyes: Conjunctivae and EOM are normal. Pupils are equal, round, and reactive to light.  Neck: Normal range of motion. Neck supple. No tracheal deviation present.  Cardiovascular: Normal rate.   Pulmonary/Chest: Effort normal.  Abdominal: Soft. There is no tenderness.  Musculoskeletal: Normal  range of motion.  Neurological: She is alert and oriented to person, place, and time.  Skin: Skin is warm and dry.  Induration in the pilonidal area with central fluctuance. There is a linear incisional scar present. No drainage. Exquisitely tender.   Psychiatric: She has a normal mood and affect. Her behavior is normal.  Nursing note and vitals reviewed.  ED Course  Procedures  DIAGNOSTIC STUDIES: Oxygen Saturation is 100% on RA, normal by my interpretation.     COORDINATION OF CARE: 8:35 PM Discussed treatment plan with pt at bedside and pt agreed to plan.  Labs Review Labs Reviewed - No data to display  Imaging Review No results found.   EKG Interpretation None      INCISION AND DRAINAGE Performed by: Elpidio Anis A Consent: Verbal consent obtained. Risks and benefits: risks, benefits and alternatives were discussed Type: abscess  Body area: pilonidal cyst  Anesthesia: local infiltration  Incision was made with a scalpel.  Local anesthetic: lidocaine 2% w/o epinephrine  Anesthetic total: 3 ml  Complexity: complex Blunt dissection to break up loculations  Drainage: purulent  Drainage amount: large, purulent  Packing material: none  Patient tolerance: Patient tolerated the procedure well with no immediate complications.    MDM   Final diagnoses:  None    1. Pilonidal abscess  Abscess requiring I&D as above note. Will refer to surgery for treatment of recurrent abscess.   I personally performed the services described in this documentation, which was scribed in my presence. The recorded information has been reviewed and is accurate.     Elpidio Anis, PA-C 05/14/15 0123  Melene Plan, DO 05/14/15 1610

## 2015-05-09 NOTE — Discharge Instructions (Signed)
Incision and Drainage °Incision and drainage is a procedure in which a sac-like structure (cystic structure) is opened and drained. The area to be drained usually contains material such as pus, fluid, or blood.  °LET YOUR CAREGIVER KNOW ABOUT:  °· Allergies to medicine. °· Medicines taken, including vitamins, herbs, eyedrops, over-the-counter medicines, and creams. °· Use of steroids (by mouth or creams). °· Previous problems with anesthetics or numbing medicines. °· History of bleeding problems or blood clots. °· Previous surgery. °· Other health problems, including diabetes and kidney problems. °· Possibility of pregnancy, if this applies. °RISKS AND COMPLICATIONS °· Pain. °· Bleeding. °· Scarring. °· Infection. °BEFORE THE PROCEDURE  °You may need to have an ultrasound or other imaging tests to see how large or deep your cystic structure is. Blood tests may also be used to determine if you have an infection or how severe the infection is. You may need to have a tetanus shot. °PROCEDURE  °The affected area is cleaned with a cleaning fluid. The cyst area will then be numbed with a medicine (local anesthetic). A small incision will be made in the cystic structure. A syringe or catheter may be used to drain the contents of the cystic structure, or the contents may be squeezed out. The area will then be flushed with a cleansing solution. After cleansing the area, it is often gently packed with a gauze or another wound dressing. Once it is packed, it will be covered with gauze and tape or some other type of wound dressing.  °AFTER THE PROCEDURE  °· Often, you will be allowed to go home right after the procedure. °· You may be given antibiotic medicine to prevent or heal an infection. °· If the area was packed with gauze or some other wound dressing, you will likely need to come back in 1 to 2 days to get it removed. °· The area should heal in about 14 days. °Document Released: 03/11/2001 Document Revised: 03/16/2012  Document Reviewed: 11/10/2011 °Exit[employer]® Patient Information ©2015 [employer]. This information is not intended to replace advice given to you by your health care provider. Make sure you discuss any questions you have with your health care provider. ° °Abscess °Care After °An abscess (also called a boil or furuncle) is an infected area that contains a collection of pus. Signs and symptoms of an abscess include pain, tenderness, redness, or hardness, or you may feel a moveable soft area under your skin. An abscess can occur anywhere in the body. The infection may spread to surrounding tissues causing cellulitis. A cut (incision) by the surgeon was made over your abscess and the pus was drained out. Gauze may have been packed into the space to provide a drain that will allow the cavity to heal from the inside outwards. The boil may be painful for 5 to 7 days. Most people with a boil do not have high fevers. Your abscess, if seen early, may not have localized, and may not have been lanced. If not, another appointment may be required for this if it does not get better on its own or with medications. °HOME CARE INSTRUCTIONS  °· Only take over-the-counter or prescription medicines for pain, discomfort, or fever as directed by your caregiver. °· When you bathe, soak and then remove gauze or iodoform packs at least daily or as directed by your caregiver. You may then wash the wound gently with mild soapy water. Repack with gauze or do as your caregiver directs. °SEEK IMMEDIATE MEDICAL CARE IF:  °·   You develop increased pain, swelling, redness, drainage, or bleeding in the wound site. °· You develop signs of generalized infection including muscle aches, chills, fever, or a general ill feeling. °· An oral temperature above 102° F (38.9° C) develops, not controlled by medication. °See your caregiver for a recheck if you develop any of the symptoms described above. If medications (antibiotics) were prescribed, take them as  directed. °Document Released: 04/03/2005 Document Revised: 12/08/2011 Document Reviewed: 11/29/2007 °Exit[employer]® Patient Information ©2015 [employer]. This information is not intended to replace advice given to you by your health care provider. Make sure you discuss any questions you have with your health care provider. ° °

## 2015-05-09 NOTE — ED Notes (Signed)
Pt states over the last three days she believes she's developed an abscess to sacral area because it's causing her a lot of pain to keep her casted right leg elevated. Also states she believes she has a fever because she's been having cold sweats with hot flashes since this started. States she took some Ibuprofen around 15:00 today.
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
## 2016-06-14 ENCOUNTER — Encounter (HOSPITAL_COMMUNITY): Payer: Self-pay | Admitting: *Deleted

## 2016-06-14 ENCOUNTER — Emergency Department (HOSPITAL_COMMUNITY)
Admission: EM | Admit: 2016-06-14 | Discharge: 2016-06-14 | Disposition: A | Discharge status: Home / Self Care | Payer: 59 | Attending: Emergency Medicine | Admitting: Emergency Medicine

## 2016-06-14 DIAGNOSIS — F17210 Nicotine dependence, cigarettes, uncomplicated: Secondary | ICD-10-CM | POA: Insufficient documentation

## 2016-06-14 DIAGNOSIS — F1994 Other psychoactive substance use, unspecified with psychoactive substance-induced mood disorder: Secondary | ICD-10-CM

## 2016-06-14 DIAGNOSIS — F1914 Other psychoactive substance abuse with psychoactive substance-induced mood disorder: Secondary | ICD-10-CM | POA: Insufficient documentation

## 2016-06-14 DIAGNOSIS — F1020 Alcohol dependence, uncomplicated: Secondary | ICD-10-CM

## 2016-06-14 DIAGNOSIS — Z5181 Encounter for therapeutic drug level monitoring: Secondary | ICD-10-CM | POA: Insufficient documentation

## 2016-06-14 DIAGNOSIS — F1024 Alcohol dependence with alcohol-induced mood disorder: Secondary | ICD-10-CM

## 2016-06-14 DIAGNOSIS — F329 Major depressive disorder, single episode, unspecified: Secondary | ICD-10-CM | POA: Insufficient documentation

## 2016-06-14 DIAGNOSIS — F32A Depression, unspecified: Secondary | ICD-10-CM

## 2016-06-14 DIAGNOSIS — F3289 Other specified depressive episodes: Secondary | ICD-10-CM

## 2016-06-14 DIAGNOSIS — F10920 Alcohol use, unspecified with intoxication, uncomplicated: Secondary | ICD-10-CM

## 2016-06-14 DIAGNOSIS — F10120 Alcohol abuse with intoxication, uncomplicated: Secondary | ICD-10-CM | POA: Insufficient documentation

## 2016-06-14 LAB — RAPID URINE DRUG SCREEN, HOSP PERFORMED
Amphetamines: NOT DETECTED
BENZODIAZEPINES: POSITIVE — AB
Barbiturates: NOT DETECTED
Cocaine: NOT DETECTED
Opiates: NOT DETECTED
Tetrahydrocannabinol: NOT DETECTED

## 2016-06-14 LAB — I-STAT BETA HCG BLOOD, ED (MC, WL, AP ONLY): I-stat hCG, quantitative: <5 m[IU]/mL (ref <5)

## 2016-06-14 LAB — CBC
HCT: 45.1 % (ref 36.0–46.0)
Hemoglobin: 15.7 g/dL — ABNORMAL HIGH (ref 12.0–15.0)
MCH: 30.5 pg (ref 26.0–34.0)
MCHC: 34.8 g/dL (ref 30.0–36.0)
MCV: 87.7 fL (ref 78.0–100.0)
PLATELETS: 364 10*3/uL (ref 150–400)
RBC: 5.14 MIL/uL — ABNORMAL HIGH (ref 3.87–5.11)
RDW: 12.5 % (ref 11.5–15.5)
WBC: 10.6 10*3/uL — AB (ref 4.0–10.5)

## 2016-06-14 LAB — COMPREHENSIVE METABOLIC PANEL
ALK PHOS: 81 U/L (ref 38–126)
ALT: 22 U/L (ref 14–54)
AST: 22 U/L (ref 15–41)
Albumin: 4.7 g/dL (ref 3.5–5.0)
Anion gap: 10 (ref 5–15)
BUN: 6 mg/dL (ref 6–20)
CO2: 22 mmol/L (ref 22–32)
Calcium: 9.1 mg/dL (ref 8.9–10.3)
Chloride: 110 mmol/L (ref 101–111)
Creatinine, Ser: 0.73 mg/dL (ref 0.44–1.00)
GFR calc Af Amer: >60 mL/min (ref >60)
GFR calc non Af Amer: >60 mL/min (ref >60)
Glucose, Bld: 94 mg/dL (ref 65–99)
Potassium: 3.6 mmol/L (ref 3.5–5.1)
SODIUM: 142 mmol/L (ref 135–145)
TOTAL PROTEIN: 8 g/dL (ref 6.5–8.1)
Total Bilirubin: 0.3 mg/dL (ref 0.3–1.2)

## 2016-06-14 LAB — ACETAMINOPHEN LEVEL: Acetaminophen (Tylenol), Serum: <10 ug/mL — ABNORMAL LOW (ref 10–30)

## 2016-06-14 LAB — ETHANOL: ALCOHOL ETHYL (B): 288 mg/dL — AB (ref <5)

## 2016-06-14 LAB — SALICYLATE LEVEL

## 2016-06-14 MED ORDER — ONDANSETRON 4 MG PO TBDP: 4.0000 mg | ORAL_TABLET | Freq: Four times a day (QID) | ORAL | Status: DC | PRN

## 2016-06-14 MED ORDER — CHLORDIAZEPOXIDE HCL 25 MG PO CAPS: 25.0000 mg | ORAL_CAPSULE | Freq: Three times a day (TID) | ORAL | Status: DC

## 2016-06-14 MED ORDER — CHLORDIAZEPOXIDE HCL 25 MG PO CAPS: 25.0000 mg | ORAL_CAPSULE | ORAL | Status: DC

## 2016-06-14 MED ORDER — LOPERAMIDE HCL 2 MG PO CAPS: 2.0000 mg | ORAL_CAPSULE | ORAL | Status: DC | PRN

## 2016-06-14 MED ORDER — CHLORDIAZEPOXIDE HCL 25 MG PO CAPS: 25.0000 mg | ORAL_CAPSULE | Freq: Four times a day (QID) | ORAL | Status: DC

## 2016-06-14 MED ORDER — ONDANSETRON HCL 4 MG/2ML IJ SOLN
4.0000 mg | Freq: Once | INTRAMUSCULAR | Status: DC
  Filled 2016-06-14: qty 2

## 2016-06-14 MED ORDER — THIAMINE HCL 100 MG/ML IJ SOLN: 100.0000 mg | Freq: Once | INTRAMUSCULAR | Status: DC

## 2016-06-14 MED ORDER — HYDROXYZINE HCL 25 MG PO TABS: 25.0000 mg | ORAL_TABLET | Freq: Four times a day (QID) | ORAL | Status: DC | PRN

## 2016-06-14 MED ORDER — GABAPENTIN 100 MG PO CAPS: 200.0000 mg | ORAL_CAPSULE | Freq: Two times a day (BID) | ORAL | Status: DC

## 2016-06-14 MED ORDER — VITAMIN B-1 100 MG PO TABS: 100.0000 mg | ORAL_TABLET | Freq: Every day | ORAL | Status: DC

## 2016-06-14 MED ORDER — FLUOXETINE HCL 20 MG PO CAPS: 20.0000 mg | ORAL_CAPSULE | Freq: Every day | ORAL | Status: DC

## 2016-06-14 MED ORDER — CHLORDIAZEPOXIDE HCL 25 MG PO CAPS: 25.0000 mg | ORAL_CAPSULE | Freq: Four times a day (QID) | ORAL | Status: DC | PRN

## 2016-06-14 MED ORDER — CHLORDIAZEPOXIDE HCL 25 MG PO CAPS: 25.0000 mg | ORAL_CAPSULE | Freq: Every day | ORAL | Status: DC

## 2016-06-14 MED ORDER — ADULT MULTIVITAMIN W/MINERALS CH
1.0000 | ORAL_TABLET | Freq: Every day | ORAL | Status: DC
Start: 2016-06-14 — End: 2016-06-14

## 2016-06-14 MED ORDER — TRAZODONE HCL 50 MG PO TABS: 50.0000 mg | ORAL_TABLET | Freq: Every evening | ORAL | Status: DC | PRN

## 2016-06-14 NOTE — ED Provider Notes (Signed)
[facility]-EM[facility]Y DEPT Provider Note   CSN: [id number] Arrival date & time: 06/14/16  0225    History   Chief Complaint Chief Complaint  Patient presents with  . Alcohol Intoxication    LEVEL 5 CAVEAT SECONDARY TO AMS: INTOXICATION  HPI Sydney [name] is a 25 y.o. female.  25 year old female with a history of seizures presents to the emergency department for evaluation of alcohol intoxication. Patient was found by [employer] laying in the street. After being awoken, patient was attempting to urinate in the street and was altered to place. Patient became combative with EMS. She was given 5 mg of Versed. Patient initially tearful, crying, and yelling upon arrival. She alluded to suicidal ideation this evening. During my evaluation with her, she is sleeping; alert to physical and verbal stimuli. When asked if she is having thoughts of killing herself or wanting to die, she shakes her head to indicate "no". Patient will answer very little secondary to sleepiness currently. Level V caveat.   The history is provided by the EMS personnel, medical records and the patient. No language interpreter was used.  Alcohol Intoxication     Past Medical History:  Diagnosis Date  . History of seizure    x 1 - unknown cause; has appt. to see neurologist the end of August  . Trimalleolar fracture of right ankle 04/27/2015    There are no active problems to display for this patient.   Past Surgical History:  Procedure Laterality Date  . ORIF ANKLE FRACTURE Right 05/03/2015   Procedure: OPEN REDUCTION INTERNAL FIXATION (ORIF)RIGHT ANKLE TRIMALLEOLAR FRACTURE;  Surgeon: [name], MD;  Location: [facility];  Service: Orthopedics;  Laterality: Right;  . WISDOM TOOTH EXTRACTION      OB History    No data available       Home Medications    Prior to Admission medications   Medication Sig Start Date End Date Taking? Authorizing Provider  docusate sodium (COLACE) 100 MG capsule Take  1 capsule (100 mg total) by mouth 2 (two) times daily. While taking narcotic pain medicine. 05/03/15   [name], MD  enoxaparin (LOVENOX) 40 MG/0.4ML injection Inject 0.4 mLs (40 mg total) into the skin daily. 05/05/15   [name], MD  oxyCODONE (ROXICODONE) 5 MG immediate release tablet Take 1-2 tablets (5-10 mg total) by mouth every 4 (four) hours as needed for moderate pain or severe pain. 05/03/15   [name], MD  oxyCODONE-acetaminophen (PERCOCET/ROXICET) 5-325 MG per tablet Take 1-2 tablets by mouth every 4 (four) hours as needed for severe pain. 05/09/15   Elpidio Anis, PA-C  senna (SENOKOT) 8.6 MG tablet Take 2 tablets (17.2 mg total) by mouth daily. While taking narcotic pain medicine. 05/03/15   [name], MD    Family History No family history on file.  Social History Social History  Substance Use Topics  . Smoking status: Current Every Day Smoker    Packs/day: 0.33    Years: 2.00    Types: Cigarettes  . Smokeless tobacco: Never Used  . Alcohol use Yes     Comment: socially/weekends     Allergies   Review of patient's allergies indicates no known allergies.   Review of Systems Review of Systems  Unable to perform ROS: Mental status change  Psychiatric/Behavioral: Positive for agitation and behavioral problems.  Patient intoxicated   Physical Exam Updated Vital Signs BP 101/55   Pulse 105   Temp 98.8 F (37.1 C) (Oral)   Resp 24  SpO2 96%   Physical Exam  Constitutional: She is oriented to person, place, and time. She appears well-developed and well-nourished. No distress.  Sleeping, in no distress  HENT:  Head: Normocephalic and atraumatic.  Eyes: Conjunctivae and EOM are normal. No scleral icterus.  Neck: Normal range of motion.  Cardiovascular: Regular rhythm and intact distal pulses.   Tachycardia  Pulmonary/Chest: Effort normal. No respiratory distress. She has no wheezes. She has no rales.  Respirations even and unlabored  Musculoskeletal:  Normal range of motion.  Neurological: She is alert and oriented to person, place, and time.  GCS 15 on arrival; speech slurred. Patient agitated, moving all extremities. She is now sleeping. Will wake to verbal and physical stimuli.  Skin: Skin is warm and dry. No rash noted. She is not diaphoretic. No erythema. No pallor.  Nursing note and vitals reviewed.    ED Treatments / Results  Labs (all labs ordered are listed, but only abnormal results are displayed) Labs Reviewed  ETHANOL - Abnormal; Notable for the following:       Result Value   Alcohol, Ethyl (B) 288 (*)    All other components within normal limits  ACETAMINOPHEN LEVEL - Abnormal; Notable for the following:    Acetaminophen (Tylenol), Serum <10 (*)    All other components within normal limits  CBC - Abnormal; Notable for the following:    WBC 10.6 (*)    RBC 5.14 (*)    Hemoglobin 15.7 (*)    All other components within normal limits  URINE RAPID DRUG SCREEN, HOSP PERFORMED - Abnormal; Notable for the following:    Benzodiazepines POSITIVE (*)    All other components within normal limits  COMPREHENSIVE METABOLIC PANEL  SALICYLATE LEVEL  I-STAT BETA HCG BLOOD, ED ([facility], [facility], [facility] ONLY)    EKG  EKG Interpretation None       Radiology No results found.  Procedures Procedures (including critical care time)  Medications Ordered in ED Medications  ondansetron (ZOFRAN) injection 4 mg (not administered)     Initial Impression / Assessment and Plan / ED Course  I have reviewed the triage vital signs and the nursing notes.  Pertinent labs & imaging results that were available during my care of the patient were reviewed by me and considered in my medical decision making (see chart for details).  Clinical Course    25 year old female presents to the emergency department by [employer]. She was noted to be intoxicated on arrival. Patient required Versed en route secondary to agitation. She has been able to sober in the  emergency department. Patient is now much more alert. When she initially presented, patient was making statements suggestive of suicidal ideation. She denies suicidal ideation at this time. She does report some depression recently. Patient endorses a history of suicide attempt. For this reason, will consult TTS for psychiatric clearance. Patient medically cleared. Disposition to be determined by oncoming ED provider.   Final Clinical Impressions(s) / ED Diagnoses   Final diagnoses:  Alcohol intoxication, uncomplicated (HCC)  Substance induced mood disorder [facility](HCC)  Depression    New Prescriptions New Prescriptions   No medications on file     Antony MaduraKelly Kamal Jurgens, PA-C 06/14/16 [id number]    Dione Boozeavid Glick, MD 06/14/16 [phone]

## 2016-06-14 NOTE — BH Assessment (Addendum)
Tele Assessment Note   Sydney DieterShameka C [name] is a 25 y.o. female who [facility] to being extremely intoxicated and attempting to urinate in the street. When LE attempted to transfer pt to ED, pt became combative and expressed SI. Since becoming sober, pt has continually denied SI and says she doesn't remember any of the events of the night before. Pt denies having a problem with drinking, but admits to having stressful personal issues going on in her life and believes that her excessive drinking last night was a result of having those issues. Pt is open to receiving resources for OP therapy.   Diagnosis: Alcohol-induced depressive disorder, With moderate use  Past Medical History:  Past Medical History:  Diagnosis Date  . History of seizure    x 1 - unknown cause; has appt. to see neurologist the end of August  . Trimalleolar fracture of right ankle 04/27/2015    Past Surgical History:  Procedure Laterality Date  . ORIF ANKLE FRACTURE Right 05/03/2015   Procedure: OPEN REDUCTION INTERNAL FIXATION (ORIF)RIGHT ANKLE TRIMALLEOLAR FRACTURE;  Surgeon: Toni ArthursJohn Hewitt, MD;  Location: [facility];  Service: Orthopedics;  Laterality: Right;  . WISDOM TOOTH EXTRACTION      Family History: No family history on file.  Social History:  reports that she has been smoking Cigarettes.  She has a 0.66 pack-year smoking history. She has never used smokeless tobacco. She reports that she drinks alcohol. She reports that she does not use drugs.  Additional Social History:  Alcohol / Drug Use Pain Medications: pt denies Prescriptions: pt denies Over the Counter: pt denies History of alcohol / drug use?: Yes (pt admits to social drinking, but denies abuse)  CIWA: CIWA-Ar BP: 119/67 Pulse Rate: 100 COWS:    PATIENT STRENGTHS: (choose at least two) Active sense of humor Average or above average intelligence Capable of independent living Communication skills Supportive  family/friends  Allergies: No Known Allergies  Home Medications:  (Not in a hospital admission)  OB/GYN Status:  No LMP recorded.  General Assessment Data [facility] of Assessment: [facility] TTS Assessment: In system Is this a Tele or Face-to-Face Assessment?: Tele Assessment Is this an Initial Assessment or a Re-assessment for this encounter?: Initial Assessment Marital status: Single Is patient pregnant?: No Pregnancy Status: No Living Arrangements: Non-relatives/Friends Can pt return to current living arrangement?: Yes Admission Status: Voluntary Is patient capable of signing voluntary admission?: Yes Referral Source: Self/Family/Friend Insurance type: [employer]     Crisis Care Plan Living Arrangements: Non-relatives/Friends Name of Psychiatrist: none Name of Therapist: none  Education Status Is patient currently in school?: No  Risk to self with the past 6 months Suicidal Ideation: No Has patient been a risk to self within the past 6 months prior to admission? : No Suicidal Intent: No Has patient had any suicidal intent within the past 6 months prior to admission? : No Is patient at risk for suicide?: No Suicidal Plan?: No Has patient had any suicidal plan within the past 6 months prior to admission? : No Access to Means: No What has been your use of drugs/alcohol within the last 12 months?: see above Previous Attempts/Gestures: No Intentional Self Injurious Behavior: None Family Suicide History: Unknown Recent stressful life event(s): Other (Comment) (personal issues) Persecutory voices/beliefs?: No Depression: No Substance abuse history and/or treatment for substance abuse?: No Suicide prevention information given to non-admitted patients: Not applicable  Risk to Others within the past 6 months Homicidal Ideation: No Does  patient have any lifetime risk of violence toward others beyond the six months prior to admission? : No Thoughts of Harm to Others: No Current  Homicidal Intent: No Current Homicidal Plan: No Access to Homicidal Means: No History of harm to others?: No Assessment of Violence: None Noted Does patient have access to weapons?: No Criminal Charges Pending?: No Does patient have a court date: No Is patient on probation?: No  Psychosis Hallucinations: None noted Delusions: None noted  Mental Status Report Appearance/Hygiene: Unremarkable Eye Contact: Fair Motor Activity: Unremarkable Speech: Logical/coherent Level of Consciousness: Alert Mood: Pleasant Affect: Appropriate to circumstance Anxiety Level: None Thought Processes: Coherent, Relevant Judgement: Unimpaired Orientation: Person, Place, Time, Situation, Appropriate for developmental age Obsessive Compulsive Thoughts/Behaviors: None  Cognitive Functioning Concentration: Normal Memory: Remote Intact, Recent Impaired IQ: Average Insight: see judgement above Impulse Control: Good Appetite: Good Sleep: No Change Vegetative Symptoms: None  ADLScreening [facility] Assessment Services) Patient's cognitive ability adequate to safely complete daily activities?: Yes Patient able to express need for assistance with ADLs?: Yes Independently performs ADLs?: Yes (appropriate for developmental age)  Prior Inpatient Therapy Prior Inpatient Therapy: Yes Prior Therapy Dates: 2006 Prior Therapy Facilty[facility]Provider[facility] Reason for Treatment: unspecified  Prior Outpatient Therapy Prior Outpatient Therapy: Yes Prior Therapy Dates: 2007 Prior Therapy Facilty/Provider(s): unknown Reason for Treatment: unspecified Does patient have an ACCT team?: No Does patient have Intensive In-House Services?  : No Does patient have [facility] services? : No Does patient have P4CC services?: No  ADL Screening (condition at time of admission) Patient's cognitive ability adequate to safely complete daily activities?: Yes Is the patient deaf or have difficulty hearing?: No Does the patient have  difficulty seeing, even when wearing glasses/contacts?: No Does the patient have difficulty concentrating, remembering, or making decisions?: No Patient able to express need for assistance with ADLs?: Yes Does the patient have difficulty dressing or bathing?: No Independently performs ADLs?: Yes (appropriate for developmental age) Does the patient have difficulty walking or climbing stairs?: No Weakness of Legs: None Weakness of Arms/Hands: None  Home Assistive Devices/Equipment Home Assistive Devices/Equipment: None  Therapy Consults (therapy consults require a physician order) PT Evaluation Needed: No OT Evalulation Needed: No SLP Evaluation Needed: No Abuse/Neglect Assessment (Assessment to be complete while patient is alone) Physical Abuse: Denies Verbal Abuse: Denies Sexual Abuse: Denies Exploitation of patient/patient's resources: Denies Self-Neglect: Denies Values / Beliefs Cultural Requests During Hospitalization: None Spiritual Requests During Hospitalization: None Consults Spiritual Care Consult Needed: No Social Work Consult Needed: No Merchant navy officer (For Healthcare) Does patient have an advance directive?: No Would patient like information on creating an advanced directive?: No - patient declined information    Additional Information 1:1 In Past 12 Months?: No CIRT Risk: No Elopement Risk: No Does patient have medical clearance?: Yes     Disposition:  Disposition Initial Assessment Completed for this Encounter: Yes (consulted with Dr. Jannifer Franklin & Assunta Found, NP) Disposition of Patient: Other dispositions Other disposition(s): Information only (pt can be given OP resources for therapy)  Laddie Aquas 06/14/2016 11:15 AM

## 2016-06-14 NOTE — ED Notes (Signed)
Pt ambulates to BR and back to room w/o assistance. Pt questioned why she is here. This RN explained her behavior and the reported story that brought her here to the ED. Pt understands that she is waiting for psych to evaluate her

## 2016-06-14 NOTE — ED Notes (Signed)
Pt found to have left room prior to moving to [facility]. Dr Pattricia BossAkinntayo notified and sts that it is ok because pt was voluntary and not a threat to herself. Pt did not receive d/c papers

## 2016-06-14 NOTE — ED Triage Notes (Signed)
Pt arrives to the ER via EMS; [place]; pt was found via [employer] laying in the street; upon waking pt pt pulled her pants sown and was attempting to urinate in the street; pt was unable to advise EMS of where she was or where she lives; once placed on the EMS stretcher pt became aggressive and combative; pt was striking EMS personnel; pt was administered 5 mg of Versed via EMS; pt is tearful, crying and yelling upon arrival; pt states that she has nothing to live for; pt states "Let me die, I want to die"; pt banging her head against the rails of the bed and attempting to strike staff; [employer] at bedside and placed pt in handcuffs; pt states that she is suicidal and does not want to live

## 2016-06-14 NOTE — ED Provider Notes (Signed)
TSS has evaluated pt: no inpt criteria at this time, pt can be d/c with outpt resources. Will d/c stable.    Samuel JesterKathleen Jillisa Harris, DO 06/14/16 1231

## 2016-06-14 NOTE — Discharge Instructions (Signed)
Substance Abuse Treatment Programs ° °Intensive Outpatient Programs °[facility]     °[address]      °[place], [place]                   °[phone]      ° °[facility] °[address] °[place], [place] °[phone] ° °[facility]     °(Inpatient and outpatient)     °[address]           °[phone]   ° °[facility] °[phone] (Suboxone and Methadone) ° °[address]      °[place], [place] [address]      °336-882-2125      ° °[address] °[place], [place] °852-3033 ° °[facility] (Outpatient/Inpatient, Chemical)    °(insurance only) [phone]      °       °[facility] (Groups & Residential) °[place], [place] °[phone] ° °   °[facility]     °[address]     °[place], [place]      °[phone]      ° °[facility] (for caregivers and family) °[address] °[place], [place] °336-299-4655 ° ° ° ° ° °Residential Treatment Programs °[facility]      °[address], [place], [place] [address]  °([phone]      ° °[facility] °[address] [place], [place] [address] °[phone] ° °[facility]        °336-248-8914      ° °[facility] °1-800-659-3381 ° °[facility]             °[address]                                         °[place], [place]                                                °877-615-2722 or 336-784-9470                              ° °[facility] [place] °[address] °[place], [address] °1.877.941.8954 ° °[facility]    °[address]      °, [place]     °336-273-5306      ° °[facility] °[address] °, [place] °336-285-9073 ° °[facility]   °[address]     °[place], [place] [address]     °336-899-1550      °Admissions: 8am-3pm M-F ° °[facility] °[address] °[place] °336-227-7417 ° °[facility]: Residential Program (90 Days)   °[place], [place]      °336-725-8389 or 800-758-6077    ° °[facility] °[place], [place] °(Walk in Hours over the weekend or by referral) ° °[facility] °[address], [place], [place] [address] °(336) [phone] ° °Crisis Mobile: [facility]:  1-877-626-1772 (for crisis response 24 hours a day) °[facility] Hotline:      1-800-256-2452 °Outpatient Psychiatry and Counseling ° °[facility]: Mobile Crisis   Management 24 hours:  1-877-626-1772 ° °[facility] sliding scale fee and walk in schedule: M-F 8am-12pm/1pm-3pm °[address]  °[place], [place] [address] °336-387-6161 ° °[facility] °[address] °[place], [place] [address] °336-703-9650 ° °[facility] ([facility])- new patient walk-in appointments available Monday - Friday 8am -3pm.          °[address] °[place], [place] [address] °336-676-6840 or crisis line- 336-676-6905 ° °[facility] Intensive Outpatient Therapy Program °[address] °[place], [place] [address] °336-832-9804 ° °[facility]                  °Crisis Services      °336.641.4993      °[address]     °[place], [place] [address]                ° °[facility]   °[facility] °[phone] °[address] °[place], [place] [address] ° ° °[facility]          °[address]  °[place], [place] [address]       °([phone]) [phone] ° °[facility] °[address] °[place], [place] [address] °336-292-1510 ° °[facility]    °[address]    °[place], [place] [address]     °336-632-3505      ° °Parish McKinney, MD     °[address]     °[place] [address]     °336-282-1251     °  °[facility] °[address] °[place] [address] ° °[facility]     °[address]     °[place], [place]      °336-542-2076      ° °[facility] °Shamsher Ahluwalia, MD °[address] °[place], [place] [address] °336-420-9558 ° °[facility]     °[address]     °[place], [place] [address]     °336-274-1237      ° °[facility] °[address] °[place], [place] [address] °336-854-4450 °Resources for Temporary Residential Assistance/Crisis Centers ° °DAY CENTERS °[facility] °M-F 8am-3pm   °[address] [place], [place] [address]   336-332-0824 °Services include: laundry, barbering, support groups, case management, phone  & computer access, showers, AA/NA mtgs, mental health/substance abuse nurse, job skills class, disability information, VA assistance, spiritual classes, etc.  ° °HOMELESS SHELTERS ° °[facility]     °[facility]   °[address], [place]     °336.271.5959       °       °[facility] (women and children)       °[address] °[place], [place] [address] °336-275-0820 °[email or web address] for application and process °Application Required ° °[facility]   °[address]    °[place] [address]     °336.886.4922       °             °[facility] °[address] °[place], [place] [address] °336.273.5572 °336-235-0363(schedule application appt.) °Application Required ° °[facility] (women only)    °[address]     °[place], [place] [address]     °336-884-1039      °  Intake starts 6pm daily Need valid ID, SSC, & Police report [facility] [place] [address] [place] [phone] Application Required  [facility] (men only)     [address]      [place], [place]     [phone]       [facility] (Pregnant women only) [address] [place], [place] [phone]  [facility]      [address]      [place], [place] [address]     [phone]             [facility] [address] [place], [place] [phone] 90 day commitment/SA/Application process  [facility](men only)     [address]     [place], [place]     [phone]       Check-in at [facility]7pm            [facility] [address] [place], [place] [address] [phone] Men/Women/Women and Children must be there by 7 pm  [facility] [place] [phone]                   Take your usual prescriptions as previously directed.  Call your regular medical doctor and the substance abuse resources on Monday to schedule a follow up appointment next week.  Return to the Emergency Department immediately sooner if worsening.

## 2016-06-22 ENCOUNTER — Encounter: Payer: Self-pay | Admitting: *Deleted

## 2016-08-30 ENCOUNTER — Encounter (HOSPITAL_COMMUNITY): Payer: Self-pay | Admitting: Emergency Medicine

## 2016-08-30 ENCOUNTER — Emergency Department (HOSPITAL_COMMUNITY)
Admission: EM | Admit: 2016-08-30 | Discharge: 2016-08-30 | Disposition: A | Discharge status: Home / Self Care | Payer: 59 | Attending: Emergency Medicine | Admitting: Emergency Medicine

## 2016-08-30 DIAGNOSIS — L0291 Cutaneous abscess, unspecified: Secondary | ICD-10-CM

## 2016-08-30 DIAGNOSIS — F17210 Nicotine dependence, cigarettes, uncomplicated: Secondary | ICD-10-CM | POA: Insufficient documentation

## 2016-08-30 DIAGNOSIS — L0231 Cutaneous abscess of buttock: Secondary | ICD-10-CM | POA: Insufficient documentation

## 2016-08-30 DIAGNOSIS — Z79899 Other long term (current) drug therapy: Secondary | ICD-10-CM | POA: Insufficient documentation

## 2016-08-30 HISTORY — DX: Unspecified convulsions: R56.9

## 2016-08-30 MED ORDER — LIDOCAINE-EPINEPHRINE (PF) 2 %-1:200000 IJ SOLN
20.0000 mL | Freq: Once | INTRAMUSCULAR | Status: AC
  Administered 2016-08-30: 20 mL via INTRADERMAL
  Filled 2016-08-30: qty 20

## 2016-08-30 MED ORDER — TRAMADOL HCL 50 MG PO TABS: 50.0000 mg | ORAL_TABLET | Freq: Four times a day (QID) | ORAL | 0 refills | Status: DC | PRN

## 2016-08-30 NOTE — ED Provider Notes (Signed)
[facility]-EMERGENCY DEPT Provider Note    By signing my name below, I, Earmon PhoenixJennifer Waddell, attest that this documentation has been prepared under the direction and in the presence of Terance HartKelly Amee Boothe, PA-C. Electronically Signed: Earmon PhoenixJennifer Waddell, ED Scribe. 08/30/16. 5:02 PM   History   Chief Complaint Chief Complaint  Patient presents with  . Cyst   HPI  HPI Comments:  Sydney [name] is an obese 25 y.o. female who presents to the Emergency Department complaining of an abscess to the right gluteal cleft that has been present for the past two months. She reports it has worsened over the past couple days. She reports having it incised and drained four months ago. She has not done anything to treat the area. Touching the area increases the pain. She denies alleviating factors. She denies drainage, fever, chills, nausea, vomiting.    Past Medical History:  Diagnosis Date  . History of seizure    x 1 - unknown cause; has appt. to see neurologist the end of August  . Seizures (HCC)   . Trimalleolar fracture of right ankle 04/27/2015    Patient Active Problem List   Diagnosis Date Noted  . Alcohol use disorder, severe, dependence (HCC) 06/14/2016  . 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 ORIF ANKLE FRACTURE Right 05/03/2015   Procedure: OPEN REDUCTION INTERNAL FIXATION (ORIF)RIGHT ANKLE TRIMALLEOLAR FRACTURE;  Surgeon: Toni ArthursJohn Hewitt, MD;  Location: [facility];  Service: Orthopedics;  Laterality: Right;  . WISDOM TOOTH EXTRACTION      OB History    No data available       Home Medications    Prior to Admission medications   Medication Sig Start Date End Date Taking? Authorizing Provider  diphenhydramine-acetaminophen (TYLENOL PM) 25-500 MG TABS tablet Take 1-2 tablets by mouth at bedtime as needed (sleep).    Historical Provider, MD    Family History No family  history on file.  Social History Social History  Substance Use Topics  . Smoking status: Current Every Day Smoker    Packs/day: 0.33    Years: 2.00    Types: Cigarettes  . Smokeless tobacco: Never Used  . Alcohol use Yes     Comment: socially/weekends     Allergies   Patient has no known allergies.   Review of Systems Review of Systems  Constitutional: Negative for chills and fever.  Gastrointestinal: Negative for nausea and vomiting.  Skin: Positive for color change.       Abscess     Physical Exam Updated Vital Signs BP 128/91 (BP Location: Left Arm)   Pulse 101   Temp 98.1 F (36.7 C) (Oral)   Resp 18   SpO2 93%   Physical Exam  Constitutional: She is oriented to person, place, and time. She appears well-developed and well-nourished.  HENT:  Head: Normocephalic and atraumatic.  Neck: Normal range of motion.  Cardiovascular: Normal rate.   Pulmonary/Chest: Effort normal.  Musculoskeletal: Normal range of motion.  Neurological: She is alert and oriented to person, place, and time.  Skin: Skin is warm and dry. There is erythema.  3 cm fluctuant abscess with surrounding cellulitis and induration on right gluteal cleft. Tenderness to palpation. No drainage.  Psychiatric: She has a normal mood and affect. Her behavior is normal.  Nursing note and vitals reviewed.    ED Treatments / Results  DIAGNOSTIC STUDIES: Oxygen Saturation is 93% on RA,  low by my interpretation.   COORDINATION OF CARE: 4:26 PM- Will incise and drain the abscess. Pt verbalizes understanding and agrees to plan.  INCISION AND DRAINAGE PROCEDURE NOTE: Patient identification was confirmed and verbal consent was obtained. This procedure was performed by Irene LimboSammy, PA-S at 4:27 PM. Supervised by Terance HartKelly Montanna Mcbain PA-C and Gerhard Munchobert Lockwood MD Site: right gluteal cleft Sterile procedures observed Needle size: 25 G Anesthetic used (type and amt): Lidocaine 2% with Epinephrine (5 mLs) Blade size:  11 Drainage: moderate purulence and blood Complexity: Complex Packing used: none Site anesthetized, incision made over site, wound drained and explored loculations, rinsed with copious amounts of normal saline, covered with dry, sterile dressing. Pt did not procedure well. Instructions for care discussed verbally and pt provided with additional written instructions for homecare and f/u.   Medications  lidocaine-EPINEPHrine (XYLOCAINE W/EPI) 2 %-1:200000 (PF) injection 20 mL (not administered)    Labs (all labs ordered are listed, but only abnormal results are displayed) Labs Reviewed - No data to display  EKG  EKG Interpretation None       Radiology No results found.  Procedures Procedures (including critical care time)  Medications Ordered in ED Medications  lidocaine-EPINEPHrine (XYLOCAINE W/EPI) 2 %-1:200000 (PF) injection 20 mL (20 mLs Intradermal Given 08/30/16 1632)     Initial Impression / Assessment and Plan / ED Course  I have reviewed the triage vital signs and the nursing notes.  Pertinent labs & imaging results that were available during my care of the patient were reviewed by me and considered in my medical decision making (see chart for details).  Clinical Course     Patient with skin abscess. Incision and drainage performed in the ED today by PA student. Unfortunately patient did not tolerate procedure well however abscess was drained as much as possible. Abscess was not large enough to warrant packing or drain placement. Supportive care and return precautions discussed.  Pt sent home with wound care instructions. The patient appears reasonably screened and/or stabilized for discharge and I doubt any other emergent medical condition requiring further screening, evaluation, or treatment in the ED prior to discharge.   I personally performed the services described in this documentation, which was scribed in my presence. The recorded information has been  reviewed and is accurate.   Final Clinical Impressions(s) / ED Diagnoses   Final diagnoses:  Abscess    New Prescriptions New Prescriptions   No medications on file     Bethel BornKelly Marie Marrisa Kimber, PA-C 08/31/16 1215    Mancel BaleElliott Wentz, MD 08/31/16 [phone]

## 2016-08-30 NOTE — ED Triage Notes (Signed)
Per pt, states cyst on tail bone for 2 months-states has gotten worse over past couple of days

## 2016-10-28 ENCOUNTER — Encounter (HOSPITAL_COMMUNITY): Payer: Self-pay

## 2016-10-28 ENCOUNTER — Emergency Department (HOSPITAL_COMMUNITY)
Admission: EM | Admit: 2016-10-28 | Discharge: 2016-10-28 | Disposition: A | Discharge status: Home / Self Care | Payer: 59 | Attending: Emergency Medicine | Admitting: Emergency Medicine

## 2016-10-28 DIAGNOSIS — F17210 Nicotine dependence, cigarettes, uncomplicated: Secondary | ICD-10-CM | POA: Insufficient documentation

## 2016-10-28 DIAGNOSIS — L0291 Cutaneous abscess, unspecified: Secondary | ICD-10-CM

## 2016-10-28 DIAGNOSIS — L0231 Cutaneous abscess of buttock: Secondary | ICD-10-CM | POA: Insufficient documentation

## 2016-10-28 MED ORDER — LIDOCAINE HCL (PF) 1 % IJ SOLN
5.0000 mL | Freq: Once | INTRAMUSCULAR | Status: DC
Start: 2016-10-28 — End: 2016-10-28

## 2016-10-28 MED ORDER — DOXYCYCLINE HYCLATE 100 MG PO CAPS: 100.0000 mg | ORAL_CAPSULE | Freq: Two times a day (BID) | ORAL | 0 refills | Status: DC

## 2016-10-28 MED ORDER — LIDOCAINE HCL 1 % IJ SOLN
INTRAMUSCULAR | Status: AC
  Filled 2016-10-28: qty 20

## 2016-10-28 NOTE — Discharge Instructions (Signed)
Take the prescribed medication as directed.  Recommend warm soaks at home to help with healing, drainage, etc. Follow-up with your primary care doctor. Return to the ED for new or worsening symptoms.

## 2016-10-28 NOTE — ED Notes (Signed)
Pt given discharge teaching on dressing change and wound care. Pt verbalizes understanding.

## 2016-10-28 NOTE — ED Triage Notes (Signed)
PT C/O A CURRENT ABSCESS TO THE TAILBONE AREA X1 WEEK, BUT GOT WORSE THE LAST 2 DAYS. DENIES FEVER OR DRAINAGE.

## 2016-10-28 NOTE — ED Provider Notes (Signed)
[facility] Provider Note   CSN: [id number] Arrival date & time: 10/28/16  1225   By signing my name below, I, Soijett Blue, attest that this documentation has been prepared under the direction and in the presence of Sharilyn SitesLisa Glenmore Karl, PA-C Electronically Signed: Soijett Blue, ED Scribe. 10/28/16. 2:23 PM.  History   Chief Complaint Chief Complaint  Patient presents with  . Abscess    HPI Sydney [name] is a 26 y.o. female who presents to the Emergency Department complaining of gradually worsening abscess to right gluteal cleft onset 1 week worsening 2 days ago. Pt notes that she has had abscesses to the affected area in the past with I&D completed. She has not tried any medications for the relief of of her symptoms. She denies fever, chills, drainage, and any other symptoms. Denies allergies to medications.   The history is provided by the patient. No language interpreter was used.    Past Medical History:  Diagnosis Date  . History of seizure    x 1 - unknown cause; has appt. to see neurologist the end of August  . Seizures (HCC)   . Trimalleolar fracture of right ankle 04/27/2015    Patient Active Problem List   Diagnosis Date Noted  . Alcohol use disorder, severe, dependence (HCC) 06/14/2016  . 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 ORIF ANKLE FRACTURE Right 05/03/2015   Procedure: OPEN REDUCTION INTERNAL FIXATION (ORIF)RIGHT ANKLE TRIMALLEOLAR FRACTURE;  Surgeon: Toni ArthursJohn Hewitt, MD;  Location: [facility];  Service: Orthopedics;  Laterality: Right;  . WISDOM TOOTH EXTRACTION      OB History    No data available       Home Medications    Prior to Admission medications   Medication Sig Start Date End Date Taking? Authorizing Provider  diphenhydramine-acetaminophen (TYLENOL PM) 25-500 MG TABS tablet Take 1-2 tablets by mouth at bedtime as  needed (sleep).    Historical Provider, MD  traMADol (ULTRAM) 50 MG tablet Take 1 tablet (50 mg total) by mouth every 6 (six) hours as needed. 08/30/16   Bethel BornKelly Marie Gekas, PA-C    Family History History reviewed. No pertinent family history.  Social History Social History  Substance Use Topics  . Smoking status: Current Every Day Smoker    Packs/day: 0.33    Years: 2.00    Types: Cigarettes  . Smokeless tobacco: Never Used  . Alcohol use Yes     Comment: socially/weekends     Allergies   Patient has no known allergies.   Review of Systems Review of Systems  Constitutional: Negative for chills and fever.  Skin: Negative for color change.       +abscess to right gluteal cleft without drainage  All other systems reviewed and are negative.    Physical Exam Updated Vital Signs BP 145/89   Pulse 90   Temp 98.5 F (36.9 C) (Oral)   Resp 17   Ht 5\' 5"  (1.651 m)   Wt 200 lb (90.7 kg)   LMP 10/19/2016   SpO2 100%   BMI 33.28 kg/m   Physical Exam  Constitutional: She is oriented to person, place, and time. She appears well-developed and well-nourished.  HENT:  Head: Normocephalic and atraumatic.  Mouth/Throat: Oropharynx is clear and moist.  Eyes: Conjunctivae and EOM are normal. Pupils are equal, round, and reactive to light.  Neck: Normal range of motion.  Cardiovascular:  Normal rate, regular rhythm and normal heart sounds.   Pulmonary/Chest: Effort normal and breath sounds normal.  Abdominal: Soft. Bowel sounds are normal.  Musculoskeletal: Normal range of motion.  Neurological: She is alert and oriented to person, place, and time.  Skin: Skin is warm and dry.  3 cm abscess to right gluteal cleft. Mild surrounding erythema with central fluctuance.   Psychiatric: She has a normal mood and affect.  Nursing note and vitals reviewed.    ED Treatments / Results  DIAGNOSTIC STUDIES: Oxygen Saturation is 100% on RA, nl by my interpretation.    COORDINATION OF  CARE: 2:16 PM Discussed treatment plan with pt at bedside which includes abx Rx and pt agreed to plan.  Procedures Procedures (including critical care time)  Medications Ordered in ED Medications - No data to display   Initial Impression / Assessment and Plan / ED Course  I have reviewed the triage vital signs and the nursing notes.  26 year old female here with abscess to the right gluteal cleft. History of same requiring I&D. She is afebrile and nontoxic. There is central fluctuance. I&D was about to be performed, however after patient use the restroom in the ED abscess ruptured and began draining spontaneously.  There was a large amount of foul smelling, purulent material expressed.  Will start on abx given her history.  encouraged warm compresses/soaks at home.  Close PCP follow-up if not improving in the next few days.  Discussed plan with patient, she acknowledged understanding and agreed with plan of care.  Return precautions given for new or worsening symptoms.  Final Clinical Impressions(s) / ED Diagnoses   Final diagnoses:  Abscess    New Prescriptions New Prescriptions   DOXYCYCLINE (VIBRAMYCIN) 100 MG CAPSULE    Take 1 capsule (100 mg total) by mouth 2 (two) times daily.   I personally performed the services described in this documentation, which was scribed in my presence. The recorded information has been reviewed and is accurate.    Garlon Hatchet, PA-C 10/28/16 1534    Gwyneth Sprout, MD 10/29/16 2138

## 2018-12-21 ENCOUNTER — Ambulatory Visit: Payer: 59 | Admitting: Obstetrics and Gynecology

## 2019-02-02 ENCOUNTER — Ambulatory Visit: Payer: 59 | Admitting: Obstetrics and Gynecology

## 2019-04-11 ENCOUNTER — Other Ambulatory Visit: Payer: Self-pay

## 2019-04-11 ENCOUNTER — Ambulatory Visit (INDEPENDENT_AMBULATORY_CARE_PROVIDER_SITE_OTHER): Payer: 59 | Admitting: Obstetrics and Gynecology

## 2019-04-11 ENCOUNTER — Encounter: Payer: Self-pay | Admitting: Obstetrics and Gynecology

## 2019-04-11 VITALS — BP 140/89 | HR 80 | Temp 97.9°F | Ht 65.0 in | Wt 187.0 lb

## 2019-04-11 DIAGNOSIS — Z113 Encounter for screening for infections with a predominantly sexual mode of transmission: Secondary | ICD-10-CM

## 2019-04-11 DIAGNOSIS — N760 Acute vaginitis: Secondary | ICD-10-CM

## 2019-04-11 DIAGNOSIS — Z124 Encounter for screening for malignant neoplasm of cervix: Secondary | ICD-10-CM | POA: Diagnosis not present

## 2019-04-11 DIAGNOSIS — Z01419 Encounter for gynecological examination (general) (routine) without abnormal findings: Secondary | ICD-10-CM | POA: Diagnosis not present

## 2019-04-11 DIAGNOSIS — B9689 Other specified bacterial agents as the cause of diseases classified elsewhere: Secondary | ICD-10-CM

## 2019-04-11 DIAGNOSIS — N898 Other specified noninflammatory disorders of vagina: Secondary | ICD-10-CM

## 2019-04-11 DIAGNOSIS — R300 Dysuria: Secondary | ICD-10-CM

## 2019-04-11 LAB — POCT URINALYSIS DIPSTICK
Bilirubin, UA: NEGATIVE
Glucose, UA: NEGATIVE
Nitrite, UA: NEGATIVE
Protein, UA: POSITIVE — AB
Spec Grav, UA: 1.02 (ref 1.010–1.025)
Urobilinogen, UA: 0.2 E.U./dL
pH, UA: 6 (ref 5.0–8.0)

## 2019-04-11 NOTE — Progress Notes (Signed)
Subjective:     Sydney [name] is a 28 y.o. female G1P0010 with BMI 31 who is here for a comprehensive physical exam. The patient reports concerns for recurrent BV infection. She describes the presence of an occasional vaginal discharge which she treats with boric acid. She denies vaginal pruritis or the presence of an odor. Patient reports a regular monthly period lasting 5 days. She is sexually active without contraception and would welcome a pregnancy. Patient also reports urinary frequency and urgency. She is otherwise without any other complaints  Past Medical History:  Diagnosis Date  . History of seizure    x 1 - unknown cause; has appt. to see neurologist the end of August  . Seizures ([place])   . Trimalleolar fracture of right ankle 04/27/2015   Past Surgical History:  Procedure Laterality Date  . ORIF ANKLE FRACTURE Right 05/03/2015   Procedure: OPEN REDUCTION INTERNAL FIXATION (ORIF)RIGHT ANKLE TRIMALLEOLAR FRACTURE;  Surgeon: Wylene Simmer, MD;  Location: [place];  Service: Orthopedics;  Laterality: Right;  . WISDOM TOOTH EXTRACTION     History reviewed. No pertinent family history.  Social History   Socioeconomic History  . Marital status: Single    Spouse name: Not on file  . Number of children: Not on file  . Years of education: Not on file  . Highest education level: Not on file  Occupational History  . Not on file  Social Needs  . Financial resource strain: Not on file  . Food insecurity    Worry: Not on file    Inability: Not on file  . Transportation needs    Medical: Not on file    Non-medical: Not on file  Tobacco Use  . Smoking status: Current Every Day Smoker    Packs/day: 0.33    Years: 2.00    Pack years: 0.66    Types: Cigarettes  . Smokeless tobacco: Never Used  Substance and Sexual Activity  . Alcohol use: Yes    Comment: socially/weekends  . Drug use: No  . Sexual activity: Yes    Birth control/protection: None  Lifestyle   . Physical activity    Days per week: Not on file    Minutes per session: Not on file  . Stress: Not on file  Relationships  . Social Herbalist on phone: Not on file    Gets together: Not on file    Attends religious service: Not on file    Active member of club or organization: Not on file    Attends meetings of clubs or organizations: Not on file    Relationship status: Not on file  . Intimate partner violence    Fear of current or ex partner: Not on file    Emotionally abused: Not on file    Physically abused: Not on file    Forced sexual activity: Not on file  Other Topics Concern  . Not on file  Social History Narrative  . Not on file   Health Maintenance  Topic Date Due  . HIV Screening  04/12/2006  . TETANUS/TDAP  04/12/2010  . PAP-Cervical Cytology Screening  04/12/2012  . PAP SMEAR-Modifier  11/15/2017  . INFLUENZA VACCINE  04/30/2019       Review of Systems Pertinent items are noted in HPI.   Objective:   Vitals:   04/11/19 1424  BP: 140/89  Pulse: 80  Temp: 97.9 F (36.6 C)       GENERAL: Well-developed, well-nourished  female in no acute distress.  HEENT: Normocephalic, atraumatic. Sclerae anicteric.  NECK: Supple. Normal thyroid.  LUNGS: Clear to auscultation bilaterally.  HEART: Regular rate and rhythm. BREASTS: Symmetric in size. No palpable masses or lymphadenopathy, skin changes, or nipple drainage. ABDOMEN: Soft, nontender, nondistended. No organomegaly. PELVIC: Normal external female genitalia. Vagina is pink and rugated.  Normal discharge. Normal appearing cervix. Uterus is normal in size. No adnexal mass or tenderness. EXTREMITIES: No cyanosis, clubbing, or edema, 2+ distal pulses.    Assessment:    Healthy female exam.      Plan:    Pap smear collected Urine culture collected STI screen per patient request Wet prep collected Patient will be contacted with abnormal results Patient advised to take prenatal  vitamins See After Visit Summary for Counseling Recommendations

## 2019-04-11 NOTE — Progress Notes (Signed)
New Patient presents for Annual Exam today.  CC: Recurrent BV infections pt has been using Boric acid supp.  Pt states sx's will return after 1 week.  Pt also notes dysuria may have UTI   Last pap: 2014 or 2016 per pt   LMP:?  Contraception: NONE   pt cannot not remember date of LMP  states cycyles are regular.   STD Screening:[facility] Full Panel

## 2019-04-12 LAB — HEPATITIS B SURFACE ANTIGEN: Hepatitis B Surface Ag: NEGATIVE

## 2019-04-12 LAB — CERVICOVAGINAL ANCILLARY ONLY
Bacterial vaginitis: POSITIVE — AB
Candida vaginitis: NEGATIVE
Chlamydia: NEGATIVE
Neisseria Gonorrhea: NEGATIVE
Trichomonas: POSITIVE — AB

## 2019-04-12 LAB — CYTOLOGY - PAP: Diagnosis: NEGATIVE

## 2019-04-12 LAB — HIV ANTIBODY (ROUTINE TESTING W REFLEX): HIV Screen 4th Generation wRfx: NONREACTIVE

## 2019-04-12 LAB — RPR: RPR Ser Ql: NONREACTIVE

## 2019-04-12 LAB — HEPATITIS C ANTIBODY: Hep C Virus Ab: <0.1 s/co ratio (ref 0.0–0.9)

## 2019-04-13 LAB — URINE CULTURE

## 2019-04-13 MED ORDER — NITROFURANTOIN MONOHYD MACRO 100 MG PO CAPS: 100.0000 mg | ORAL_CAPSULE | Freq: Two times a day (BID) | ORAL | 1 refills | Status: DC

## 2019-04-13 MED ORDER — METRONIDAZOLE 500 MG PO TABS: 500.0000 mg | ORAL_TABLET | Freq: Two times a day (BID) | ORAL | 0 refills | Status: DC

## 2019-04-13 NOTE — Addendum Note (Signed)
Addended by: Mora Bellman on: 04/13/2019 09:47 AM   Modules accepted: Orders

## 2019-04-13 NOTE — Addendum Note (Signed)
Addended by: Mora Bellman on: 04/13/2019 10:53 AM   Modules accepted: Orders

## 2019-06-30 ENCOUNTER — Encounter: Payer: Self-pay | Admitting: Physician Assistant

## 2019-06-30 ENCOUNTER — Ambulatory Visit (INDEPENDENT_AMBULATORY_CARE_PROVIDER_SITE_OTHER): Payer: 59 | Admitting: Physician Assistant

## 2019-06-30 VITALS — Ht 65.0 in | Wt 180.0 lb

## 2019-06-30 DIAGNOSIS — R059 Cough, unspecified: Secondary | ICD-10-CM

## 2019-06-30 DIAGNOSIS — R05 Cough: Secondary | ICD-10-CM | POA: Diagnosis not present

## 2019-06-30 DIAGNOSIS — Z20828 Contact with and (suspected) exposure to other viral communicable diseases: Secondary | ICD-10-CM

## 2019-06-30 MED ORDER — AZITHROMYCIN 250 MG PO TABS: ORAL_TABLET | ORAL | 0 refills | Status: AC

## 2019-06-30 NOTE — Progress Notes (Signed)
Virtual Visit via Video   I connected with Phillip Heal on 06/30/19 at 10:40 AM EDT by a video enabled telemedicine application and verified that I am speaking with the correct person using two identifiers. Location patient: Home Location provider:  [facility], Office Persons participating in the virtual visit: Fynlee, [place] PA-C I discussed the limitations of evaluation and management by telemedicine and the availability of in person appointments. The patient expressed understanding and agreed to proceed.    Subjective:   HPI:   Cough Pt c/o cough x 1 week, chest congestion and expectorating yellow sputum. Denies fever or chills. She is taking Mucinex and Robitussin has helped loosen it up. Denies headaches, dizziness, nausea or diarrhea, shortness of breath, chest pain.  She denies any concerns with possible COVID contacts.  She is a smoker.  ROS: See pertinent positives and negatives per HPI.  Patient Active Problem List   Diagnosis Date Noted  . Alcohol use disorder, severe, dependence ([place]) 06/14/2016  . Alcohol-induced depressive disorder with moderate or severe use disorder ([place]) 06/14/2016    Social History   Tobacco Use  . Smoking status: Current Every Day Smoker    Packs/day: 0.33    Years: 2.00    Pack years: 0.66    Types: Cigarettes  . Smokeless tobacco: Never Used  Substance Use Topics  . Alcohol use: Yes    Comment: once a month beer    Current Outpatient Medications:  .  diphenhydramine-acetaminophen (TYLENOL PM) 25-500 MG TABS tablet, Take 1-2 tablets by mouth at bedtime as needed (sleep)., Disp: , Rfl:  .  Multiple Vitamins-Minerals (HAIR SKIN AND NAILS FORMULA) TABS, Take 2 tablets by mouth daily., Disp: , Rfl:  .  azithromycin (ZITHROMAX) 250 MG tablet, Take two tablets on day 1 then one daily x 4 days, Disp: 6 tablet, Rfl: 0  No Known Allergies  Objective:   VITALS: Per patient if applicable, see vitals.  GENERAL: Alert, appears well and in no acute distress. HEENT: Atraumatic, conjunctiva clear, no obvious abnormalities on inspection of external nose and ears. NECK: Normal movements of the head and neck. CARDIOPULMONARY: No increased WOB. Speaking in clear sentences. I:E ratio WNL.  MS: Moves all visible extremities without noticeable abnormality. PSYCH: Pleasant and cooperative, well-groomed. Speech normal rate and rhythm. Affect is appropriate. Insight and judgement are appropriate. Attention is focused, linear, and appropriate.  NEURO: CN grossly intact. Oriented as arrived to appointment on time with no prompting. Moves both UE equally.  SKIN: No obvious lesions, wounds, erythema, or cyanosis noted on face or hands.  Assessment and Plan:   Banessa was seen today for establish care, cough and chest congestion.  Diagnoses and all orders for this visit:  Cough -     Novel Coronavirus, NAA ([facility])  Other orders -     azithromycin (ZITHROMAX) 250 MG tablet; Take two tablets on day 1 then one daily x 4 days   Patient has a respiratory illness without signs of acute distress or respiratory compromise at this time. This is likely a viral infection, which can come from a number of respiratory viruses.  However, given that she is a smoker, I will go ahead and empirically treat with a Z-Pak to cover for any early pneumonia.  We are going to send patient for drive-up testing. As a precaution, they have been advised to remain home until COVID-19 results and then possible further quarantine after that based on results and symptoms. Advised if  they experience a "second sickening" or worsening symptoms as the illness progresses, they are to call the office for further instructions or seek emergent evaluation for any severe symptoms.    . Reviewed expectations re: course of current medical issues. . Discussed self-management of symptoms. . Outlined signs and symptoms indicating need for more acute  intervention. . Patient verbalized understanding and all questions were answered. Marland Kitchen Health Maintenance issues including appropriate healthy diet, exercise, and smoking avoidance were discussed with patient. . See orders for this visit as documented in the electronic medical record.  I discussed the assessment and treatment plan with the patient. The patient was provided an opportunity to ask questions and all were answered. The patient agreed with the plan and demonstrated an understanding of the instructions.   The patient was advised to call back or seek an in-person evaluation if the symptoms worsen or if the condition fails to improve as anticipated.   CMA or LPN served as scribe during this visit. History, Physical, and Plan performed by medical provider. The above documentation has been reviewed and is accurate and complete.   Jarold Motto, [place] 06/30/2019

## 2019-07-15 ENCOUNTER — Other Ambulatory Visit: Payer: Self-pay

## 2019-07-15 DIAGNOSIS — Z20822 Contact with and (suspected) exposure to covid-19: Secondary | ICD-10-CM

## 2019-07-17 LAB — NOVEL CORONAVIRUS, NAA: SARS-CoV-2, NAA: NOT DETECTED

## 2020-03-07 ENCOUNTER — Encounter (HOSPITAL_COMMUNITY): Payer: Self-pay | Admitting: Emergency Medicine

## 2020-03-07 ENCOUNTER — Other Ambulatory Visit: Payer: Self-pay

## 2020-03-07 ENCOUNTER — Emergency Department (HOSPITAL_COMMUNITY)
Admission: EM | Admit: 2020-03-07 | Discharge: 2020-03-08 | Disposition: A | Discharge status: Home / Self Care | Payer: BC Managed Care – PPO | Attending: Emergency Medicine | Admitting: Emergency Medicine

## 2020-03-07 DIAGNOSIS — R0989 Other specified symptoms and signs involving the circulatory and respiratory systems: Secondary | ICD-10-CM

## 2020-03-07 DIAGNOSIS — E860 Dehydration: Secondary | ICD-10-CM | POA: Insufficient documentation

## 2020-03-07 DIAGNOSIS — R112 Nausea with vomiting, unspecified: Secondary | ICD-10-CM | POA: Diagnosis not present

## 2020-03-07 DIAGNOSIS — F17210 Nicotine dependence, cigarettes, uncomplicated: Secondary | ICD-10-CM | POA: Insufficient documentation

## 2020-03-07 DIAGNOSIS — F458 Other somatoform disorders: Secondary | ICD-10-CM | POA: Insufficient documentation

## 2020-03-07 DIAGNOSIS — F419 Anxiety disorder, unspecified: Secondary | ICD-10-CM | POA: Insufficient documentation

## 2020-03-07 DIAGNOSIS — R000 Tachycardia, unspecified: Secondary | ICD-10-CM | POA: Diagnosis not present

## 2020-03-07 MED ORDER — LORAZEPAM 1 MG PO TABS
0.5000 mg | ORAL_TABLET | Freq: Once | ORAL | Status: AC
  Administered 2020-03-07: 0.5 mg via ORAL
  Filled 2020-03-07: qty 1

## 2020-03-07 MED ORDER — ALUM & MAG HYDROXIDE-SIMETH 200-200-20 MG/5ML PO SUSP
30.0000 mL | Freq: Once | ORAL | Status: AC
  Administered 2020-03-07: 30 mL via ORAL
  Filled 2020-03-07: qty 30

## 2020-03-07 MED ORDER — LIDOCAINE VISCOUS HCL 2 % MT SOLN
15.0000 mL | Freq: Once | OROMUCOSAL | Status: AC
  Administered 2020-03-07: 15 mL via ORAL
  Filled 2020-03-07: qty 15

## 2020-03-07 MED ORDER — SODIUM CHLORIDE 0.9 % IV BOLUS
1000.0000 mL | Freq: Once | INTRAVENOUS | Status: AC
  Administered 2020-03-07: 1000 mL via INTRAVENOUS

## 2020-03-07 NOTE — ED Triage Notes (Addendum)
Pt presents to Ed POV. Pt c/o emesis x37m. Pt thinks she swallowed fly and its stuck in throat. NAD

## 2020-03-08 MED ORDER — ONDANSETRON HCL 4 MG PO TABS: 4.0000 mg | ORAL_TABLET | Freq: Three times a day (TID) | ORAL | 0 refills | Status: AC | PRN

## 2020-03-08 NOTE — Discharge Instructions (Signed)
Follow-up with your primary care doctor as needed if you continue to have the feeling that something is in your throat. Use Zofran as needed for nausea or vomiting. Make sure you stay well-hydrated water. Return to the emergency room if any new, worsening, concerning symptoms.

## 2020-03-08 NOTE — ED Provider Notes (Signed)
[facility] EMERGENCY DEPARTMENT Provider Note   CSN: [id number] Arrival date & time: 03/07/20  2104     History Chief Complaint  Patient presents with  . Emesis    Sydney [name] is a 29 y.o. female presenting for evaluation of emesis and throat irritation.  Patient states just prior to arrival she swallowed a fly, and she can feel it flying from her stomach to her throat.  Due to this, she had several episodes of nausea and vomiting.  She denies nausea currently, but states she can still feel the fly coming into her throat.  She denies history of similar.  She reports a history of "bad reflux" when she was in high school, but does not take anything for it now.  She states she takes no medications daily.  She smokes cigarettes daily, has intermittent alcohol use (none today), and denies drug use.  She denies chest pain or shortness of breath.  She denies abdominal pain, urinary symptoms, abnormal bowel movements.  Additional history obtained from chart review.  Per chart review, patient has a history of alcohol use.  HPI     Past Medical History:  Diagnosis Date  . History of chicken pox   . History of seizure    x 1 - unknown cause; has appt. to see neurologist the end of August  . Seizures ([place])   . Trimalleolar fracture of right ankle 04/27/2015    Patient Active Problem List   Diagnosis Date Noted  . Alcohol use disorder, severe, dependence ([place]) 06/14/2016  . 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 ORIF ANKLE FRACTURE Right 05/03/2015   Procedure: OPEN REDUCTION INTERNAL FIXATION (ORIF)RIGHT ANKLE TRIMALLEOLAR FRACTURE;  Surgeon: Wylene Simmer, MD;  Location: [place];  Service: Orthopedics;  Laterality: Right;  . WISDOM TOOTH EXTRACTION       OB History    Gravida  1   Para      Term      Preterm      AB  1   Living   0     SAB      TAB  1   Ectopic      Multiple      Live Births              Family History  Problem Relation Age of Onset  . Liver disease Father   . Lung cancer Maternal Grandmother   . Colon cancer Paternal Grandfather   . Breast cancer Maternal Aunt   . Breast cancer Paternal Aunt     Social History   Tobacco Use  . Smoking status: Current Every Day Smoker    Packs/day: 0.33    Years: 2.00    Pack years: 0.66    Types: Cigarettes  . Smokeless tobacco: Never Used  Substance Use Topics  . Alcohol use: Yes    Comment: once a month beer  . Drug use: Yes    Types: Marijuana    Comment: uses daily    Home Medications Prior to Admission medications   Medication Sig Start Date End Date Taking? Authorizing Provider  azithromycin (ZITHROMAX) 250 MG tablet Take two tablets on day 1 then one daily x 4 days Patient not taking: Reported on 03/07/2020 06/30/19   Inda Coke, PA  ondansetron (ZOFRAN) 4 MG tablet Take 1 tablet (4 mg total) by mouth every 8 (  eight) hours as needed for nausea or vomiting. 03/08/20   Goldman Birchall, PA-C    Allergies    Patient has no known allergies.  Review of Systems   Review of Systems  HENT:       Throat irritation  Gastrointestinal: Positive for nausea and vomiting.  All other systems reviewed and are negative.   Physical Exam Updated Vital Signs BP 122/84   Pulse 84   Temp 98.8 F (37.1 C) (Oral)   Resp 16   Ht 5\' 4"  (1.626 m)   Wt 77.1 kg   SpO2 100%   BMI 29.18 kg/m   Physical Exam Vitals and nursing note reviewed.  Constitutional:      General: She is not in acute distress.    Appearance: She is well-developed.     Comments: Sitting in the bed in no acute distress  HENT:     Head: Normocephalic and atraumatic.     Comments: OP clear without tonsillar swelling exudate.  Handling secretions easily. Eyes:     Conjunctiva/sclera: Conjunctivae normal.     Pupils: Pupils are equal, round, and reactive to  light.  Cardiovascular:     Rate and Rhythm: Regular rhythm. Tachycardia present.     Comments: Tachycardic around 115 at rest.  When talking about her symptoms, patient appears anxious and her heart rate goes up to 140 Pulmonary:     Effort: Pulmonary effort is normal. No respiratory distress.     Breath sounds: Normal breath sounds. No wheezing.     Comments: Clear lung sounds in all fields Abdominal:     General: There is no distension.     Palpations: Abdomen is soft. There is no mass.     Tenderness: There is no abdominal tenderness. There is no guarding or rebound.     Comments: No TTP of the abdomen  Musculoskeletal:        General: Normal range of motion.     Cervical back: Normal range of motion and neck supple.  Skin:    General: Skin is warm and dry.     Capillary Refill: Capillary refill takes less than 2 seconds.  Neurological:     Mental Status: She is alert and oriented to person, place, and time.  Psychiatric:        Mood and Affect: Mood is anxious.     Comments: Patient appears anxious     ED Results / Procedures / Treatments   Labs (all labs ordered are listed, but only abnormal results are displayed) Labs Reviewed - No data to display  EKG EKG Interpretation  Date/Time:  Wednesday March 07 2020 21:13:37 EDT Ventricular Rate:  152 PR Interval:  136 QRS Duration: 62 QT Interval:  320 QTC Calculation: 508 R Axis:   33 Text Interpretation: Sinus tachycardia Cannot rule out Anterior infarct , age undetermined Abnormal ECG Confirmed by 01-06-2004 [phone]) on 03/07/2020 9:43:27 PM   Radiology No results found.  Procedures Procedures (including critical care time)  Medications Ordered in ED Medications  alum & mag hydroxide-simeth (MAALOX/MYLANTA) 200-200-20 MG/5ML suspension 30 mL (30 mLs Oral Given 03/07/20 2340)    And  lidocaine (XYLOCAINE) 2 % viscous mouth solution 15 mL (15 mLs Oral Given 03/07/20 2340)  LORazepam (ATIVAN) tablet 0.5 mg (0.5 mg  Oral Given 03/07/20 2325)  sodium chloride 0.9 % bolus 1,000 mL (1,000 mLs Intravenous New Bag/Given 03/07/20 2350)    ED Course  I have reviewed the triage vital signs and the  nursing notes.  Pertinent labs & imaging results that were available during my care of the patient were reviewed by me and considered in my medical decision making (see chart for details).    MDM Rules/Calculators/A&P                      Patient presenting for evaluation of nausea, vomiting, and feeling like something is in her throat.  On exam, patient appears nontoxic.  She does appear anxious, and when talking better symptoms her heart rate increases significantly.  Consider anxiety as part of her symptoms.  Also consider heartburn/reflux due to feel like something is coming up into her throat.  Her airway is intact.  Will treat symptomatically with Ativan, GI cocktail, and fluids and reassess.  On reassessment, heart rate has improved significantly, as such likely accommodation of anxiety and dehydration causing tachycardia.  No further nausea or vomiting, patient is tolerating p.o.  She continues to have some discomfort in her throat, discussed that at this time I do not see any emergent or life-threatening condition.  Discussed follow-up with primary care as needed for further evaluation.  At this time, patient appears safe for discharge.  Return precautions given.  Patient states she understands and agrees to plan.  Final Clinical Impression(s) / ED Diagnoses Final diagnoses:  Globus sensation  Non-intractable vomiting with nausea, unspecified vomiting type  Anxiety  Dehydration    Rx / DC Orders ED Discharge Orders         Ordered    ondansetron (ZOFRAN) 4 MG tablet  Every 8 hours PRN     03/08/20 0006           Alveria Apley, PA-C 03/08/20 0027    Tilden Fossa, MD 03/08/20 1224

## 2020-04-16 ENCOUNTER — Ambulatory Visit: Payer: BC Managed Care – PPO | Admitting: Obstetrics and Gynecology

## 2020-05-22 DIAGNOSIS — Z716 Tobacco abuse counseling: Secondary | ICD-10-CM | POA: Diagnosis not present

## 2020-05-22 DIAGNOSIS — R05 Cough: Secondary | ICD-10-CM | POA: Diagnosis not present

## 2020-05-24 ENCOUNTER — Ambulatory Visit: Payer: BC Managed Care – PPO | Admitting: Obstetrics and Gynecology

## 2020-06-21 ENCOUNTER — Other Ambulatory Visit: Payer: Self-pay

## 2020-06-21 ENCOUNTER — Other Ambulatory Visit (HOSPITAL_COMMUNITY)
Admission: RE | Admit: 2020-06-21 | Discharge: 2020-06-21 | Disposition: A | Discharge status: Home / Self Care | Payer: BC Managed Care – PPO | Source: Ambulatory Visit | Attending: Obstetrics and Gynecology | Admitting: Obstetrics and Gynecology

## 2020-06-21 ENCOUNTER — Encounter: Payer: Self-pay | Admitting: Obstetrics and Gynecology

## 2020-06-21 ENCOUNTER — Ambulatory Visit (INDEPENDENT_AMBULATORY_CARE_PROVIDER_SITE_OTHER): Payer: BC Managed Care – PPO | Admitting: Obstetrics and Gynecology

## 2020-06-21 VITALS — BP 146/86 | HR 71 | Ht 64.0 in | Wt 179.0 lb

## 2020-06-21 DIAGNOSIS — Z113 Encounter for screening for infections with a predominantly sexual mode of transmission: Secondary | ICD-10-CM | POA: Insufficient documentation

## 2020-06-21 DIAGNOSIS — Z01419 Encounter for gynecological examination (general) (routine) without abnormal findings: Secondary | ICD-10-CM | POA: Insufficient documentation

## 2020-06-21 DIAGNOSIS — N898 Other specified noninflammatory disorders of vagina: Secondary | ICD-10-CM | POA: Insufficient documentation

## 2020-06-21 DIAGNOSIS — Z124 Encounter for screening for malignant neoplasm of cervix: Secondary | ICD-10-CM | POA: Diagnosis not present

## 2020-06-21 NOTE — Progress Notes (Signed)
Patient presents for Annual Exam.  Last Pap: 04/11/2019 WNL  LMP:06/18/20 Usually lasting 4-5 days with Moderate Flow.   Contraception: None and None desired  STD Screening: Full desired  Family Hx of Breast Cancer: P.Aunt  And M Aunt  Family Hx of Ovarian Cancer: None   CC: Vaginal Discharge on/off a few months now with odor. Denies any itching.

## 2020-06-21 NOTE — Progress Notes (Signed)
Subjective:     Sydney [name] is a 29 y.o. female P0010 with BMI 30 and LMP 06/18/20 who is here for a comprehensive physical exam. The patient reports no problems. Patient reports a monthly 5-day period. She is sexually active without contraception. She reports the presence of a discharge with odor for the past few days. She denies any pelvic pain. She denies any urinary incontinence.  Past Medical History:  Diagnosis Date  . History of chicken pox   . History of seizure    x 1 - unknown cause; has appt. to see neurologist the end of August  . Seizures ([facility])   . Trimalleolar fracture of right ankle 04/27/2015   Past Surgical History:  Procedure Laterality Date  . ORIF ANKLE FRACTURE Right 05/03/2015   Procedure: OPEN REDUCTION INTERNAL FIXATION (ORIF)RIGHT ANKLE TRIMALLEOLAR FRACTURE;  Surgeon: [name], MD;  Location: [facility];  Service: Orthopedics;  Laterality: Right;  . WISDOM TOOTH EXTRACTION     Family History  Problem Relation Age of Onset  . Liver disease Father   . Lung cancer Maternal Grandmother   . Colon cancer Paternal Grandfather   . Breast cancer Maternal Aunt   . Breast cancer Paternal Aunt     Social History   Socioeconomic History  . Marital status: Single    Spouse name: Not on file  . Number of children: Not on file  . Years of education: Not on file  . Highest education level: Not on file  Occupational History  . Not on file  Tobacco Use  . Smoking status: Current Every Day Smoker    Packs/day: 0.33    Years: 2.00    Pack years: 0.66    Types: Cigarettes  . Smokeless tobacco: Never Used  Vaping Use  . Vaping Use: Never used  Substance and Sexual Activity  . Alcohol use: Yes    Comment: once a month beer  . Drug use: Yes    Types: Marijuana    Comment: uses daily  . Sexual activity: Yes    Partners: Male    Birth control/protection: None  Other Topics Concern  . Not on file  Social History Narrative   Customer  service   No children   Social Determinants of Health   Financial Resource Strain:   . Difficulty of Paying Living Expenses: Not on file  Food Insecurity:   . Worried About Programme researcher, broadcasting/film/video in the Last Year: Not on file  . Ran Out of Food in the Last Year: Not on file  Transportation Needs:   . Lack of Transportation (Medical): Not on file  . Lack of Transportation (Non-Medical): Not on file  Physical Activity:   . Days of Exercise per Week: Not on file  . Minutes of Exercise per Session: Not on file  Stress:   . Feeling of Stress : Not on file  Social Connections:   . Frequency of Communication with Friends and Family: Not on file  . Frequency of Social Gatherings with Friends and Family: Not on file  . Attends Religious Services: Not on file  . Active Member of Clubs or Organizations: Not on file  . Attends Banker Meetings: Not on file  . Marital Status: Not on file  Intimate Partner Violence:   . Fear of Current or Ex-Partner: Not on file  . Emotionally Abused: Not on file  . Physically Abused: Not on file  . Sexually Abused: Not on file  Health Maintenance  Topic Date Due  . COVID-19 Vaccine (1) Never done  . TETANUS/TDAP  Never done  . PAP-Cervical Cytology Screening  04/10/2022  . PAP SMEAR-Modifier  04/10/2022  . Hepatitis C Screening  Completed  . HIV Screening  Completed  . INFLUENZA VACCINE  Discontinued       Review of Systems Pertinent items noted in HPI and remainder of comprehensive ROS otherwise negative.   Objective:  Blood pressure (!) 146/86, pulse 71, height 5\' 4"  (1.626 m), weight 179 lb (81.2 kg), last menstrual period 06/18/2020.     GENERAL: Well-developed, well-nourished female in no acute distress.  HEENT: Normocephalic, atraumatic. Sclerae anicteric.  NECK: Supple. Normal thyroid.  LUNGS: Clear to auscultation bilaterally.  HEART: Regular rate and rhythm. BREASTS: Symmetric in size. No palpable masses or  lymphadenopathy, skin changes, or nipple drainage. ABDOMEN: Soft, nontender, nondistended. No organomegaly. PELVIC: Normal external female genitalia. Vagina is pink and rugated.  Normal discharge. Normal appearing cervix. Uterus is normal in size. No adnexal mass or tenderness. EXTREMITIES: No cyanosis, clubbing, or edema, 2+ distal pulses.    Assessment:    Healthy female exam.      Plan:    Pap smear collected STI testing per patient request Patient will be contacted with abnormal results See After Visit Summary for Counseling Recommendations

## 2020-06-22 LAB — CERVICOVAGINAL ANCILLARY ONLY
Bacterial Vaginitis (gardnerella): POSITIVE — AB
Candida Glabrata: NEGATIVE
Candida Vaginitis: NEGATIVE
Chlamydia: NEGATIVE
Comment: NEGATIVE
Comment: NEGATIVE
Comment: NEGATIVE
Comment: NEGATIVE
Comment: NEGATIVE
Comment: NORMAL
Neisseria Gonorrhea: NEGATIVE
Trichomonas: NEGATIVE

## 2020-06-22 LAB — RPR: RPR Ser Ql: NONREACTIVE

## 2020-06-22 LAB — HEPATITIS B SURFACE ANTIGEN: Hepatitis B Surface Ag: NEGATIVE

## 2020-06-22 LAB — CYTOLOGY - PAP: Diagnosis: NEGATIVE

## 2020-06-22 LAB — HIV ANTIBODY (ROUTINE TESTING W REFLEX): HIV Screen 4th Generation wRfx: NONREACTIVE

## 2020-06-22 LAB — HEPATITIS C ANTIBODY: Hep C Virus Ab: 0.2 s/co ratio (ref 0.0–0.9)

## 2020-06-25 MED ORDER — METRONIDAZOLE 500 MG PO TABS: 500.0000 mg | ORAL_TABLET | Freq: Two times a day (BID) | ORAL | 0 refills | Status: AC

## 2020-06-25 NOTE — Addendum Note (Signed)
Addended by: Catalina Antigua on: 06/25/2020 10:12 AM   Modules accepted: Orders
